# Patient Record
Sex: Male | Born: 1970 | Race: Black or African American | Hispanic: No | Marital: Married | Smoking: Former smoker
Health system: Southern US, Community
[De-identification: ages and names within clinical notes are randomized; demographics above are authoritative.]

## PROBLEM LIST (undated history)

## (undated) DIAGNOSIS — G839 Paralytic syndrome, unspecified: Secondary | ICD-10-CM

## (undated) DIAGNOSIS — G8929 Other chronic pain: Secondary | ICD-10-CM

## (undated) DIAGNOSIS — I10 Essential (primary) hypertension: Secondary | ICD-10-CM

## (undated) DIAGNOSIS — S143XXA Injury of brachial plexus, initial encounter: Secondary | ICD-10-CM

## (undated) HISTORY — PX: OTHER SURGICAL HISTORY: SHX169

## (undated) HISTORY — DX: Injury of brachial plexus, initial encounter: S14.3XXA

## (undated) HISTORY — DX: Essential (primary) hypertension: I10

---

## 1989-10-24 DIAGNOSIS — S143XXA Injury of brachial plexus, initial encounter: Secondary | ICD-10-CM

## 1989-10-24 HISTORY — DX: Injury of brachial plexus, initial encounter: S14.3XXA

## 1989-10-24 HISTORY — PX: VENA CAVA FILTER PLACEMENT: SUR1032

## 2001-06-10 ENCOUNTER — Emergency Department (HOSPITAL_COMMUNITY): Admission: EM | Admit: 2001-06-10 | Discharge: 2001-06-10 | Payer: Self-pay | Admitting: Emergency Medicine

## 2007-02-08 LAB — CONVERTED CEMR LAB
HDL: 42 mg/dL
LDL Cholesterol: 86 mg/dL

## 2008-02-06 ENCOUNTER — Encounter: Payer: Self-pay | Admitting: Internal Medicine

## 2008-08-15 LAB — CONVERTED CEMR LAB
Albumin: 4.4 g/dL
Alkaline Phosphatase: 56 units/L
CO2: 28 meq/L
Chloride: 94 meq/L
Creatinine, Ser: 0.7 mg/dL
Hgb A1c MFr Bld: 11.5 %
Sodium: 131 meq/L
Total Protein: 8.2 g/dL

## 2008-09-10 ENCOUNTER — Emergency Department (HOSPITAL_COMMUNITY): Admission: EM | Admit: 2008-09-10 | Discharge: 2008-09-10 | Payer: Self-pay | Admitting: Emergency Medicine

## 2008-09-10 LAB — CONVERTED CEMR LAB
ALT: 58 units/L
CO2: 33 meq/L
Chloride: 96 meq/L
Total Bilirubin: 0.4 mg/dL

## 2009-05-11 LAB — CONVERTED CEMR LAB
CO2: 30 meq/L
Chloride: 96 meq/L
Cholesterol: 135 mg/dL
Creatinine, Ser: 0.9 mg/dL
HDL: 42 mg/dL
Hgb A1c MFr Bld: 9.2 %
LDL Cholesterol: 82 mg/dL
Sodium: 137 meq/L
Total Bilirubin: 0.4 mg/dL
Triglyceride fasting, serum: 142 mg/dL

## 2009-07-06 ENCOUNTER — Encounter: Payer: Self-pay | Admitting: Internal Medicine

## 2009-07-06 LAB — CONVERTED CEMR LAB
CO2: 29 meq/L
Creatinine, Ser: 0.9 mg/dL
HCT: 41.1 %
LDL Cholesterol: 81 mg/dL
RDW: 15.9 %
Sodium: 137 meq/L

## 2009-09-29 LAB — CONVERTED CEMR LAB
CO2: 30 meq/L
Calcium: 9.3 mg/dL
Chloride: 97 meq/L
Creatinine, Ser: 0.7 mg/dL
HCT: 37.5 %
Hemoglobin: 11.6 g/dL
Hgb A1c MFr Bld: 9.6 %
Potassium: 4.3 meq/L
RDW: 18.9 %
Sodium: 135 meq/L
WBC: 5.3 10*3/uL

## 2009-12-13 LAB — CONVERTED CEMR LAB
ALT: 19 units/L
AST: 34 units/L
CO2: 30 meq/L
Chloride: 98 meq/L
HDL: 34 mg/dL
Lymphocytes, automated: 47.6 %
Neutrophils Relative %: 44 %
Platelets: 276 10*3/uL
RBC: 5.67 M/uL
RDW: 15.7 %
Total Bilirubin: 0.2 mg/dL
Triglyceride fasting, serum: 121 mg/dL

## 2010-01-20 ENCOUNTER — Encounter: Payer: Self-pay | Admitting: Internal Medicine

## 2010-01-20 LAB — CONVERTED CEMR LAB
BUN: 19 mg/dL
CO2: 29 meq/L
Chloride: 98 meq/L
Creatinine, Ser: 0.9 mg/dL
Hgb A1c MFr Bld: 10.3 %
Potassium: 3.9 meq/L

## 2010-02-04 ENCOUNTER — Ambulatory Visit: Payer: Self-pay | Admitting: Internal Medicine

## 2010-02-04 DIAGNOSIS — I10 Essential (primary) hypertension: Secondary | ICD-10-CM | POA: Insufficient documentation

## 2010-02-04 DIAGNOSIS — E1165 Type 2 diabetes mellitus with hyperglycemia: Secondary | ICD-10-CM

## 2010-02-04 DIAGNOSIS — E119 Type 2 diabetes mellitus without complications: Secondary | ICD-10-CM | POA: Insufficient documentation

## 2010-02-04 DIAGNOSIS — S143XXA Injury of brachial plexus, initial encounter: Secondary | ICD-10-CM | POA: Insufficient documentation

## 2010-02-04 DIAGNOSIS — Z794 Long term (current) use of insulin: Secondary | ICD-10-CM | POA: Insufficient documentation

## 2010-09-10 ENCOUNTER — Encounter: Payer: Self-pay | Admitting: Internal Medicine

## 2010-10-29 ENCOUNTER — Emergency Department (HOSPITAL_COMMUNITY)
Admission: EM | Admit: 2010-10-29 | Discharge: 2010-10-29 | Payer: Self-pay | Source: Home / Self Care | Admitting: Emergency Medicine

## 2010-10-29 ENCOUNTER — Encounter: Payer: Self-pay | Admitting: Internal Medicine

## 2010-11-23 NOTE — Letter (Signed)
Summary: Reather Littler MD  Reather Littler MD   Imported By: Lester [place] 02/09/2010 12:11:36  _____________________________________________________________________  External Attachment:    Type:   Image     Comment:   External Document

## 2010-11-23 NOTE — Letter (Signed)
Summary: [facility]  [facility]   Imported By: Sherian Rein 09/23/2010 10:05:32  _____________________________________________________________________  External Attachment:    Type:   Image     Comment:   External Document
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
## 2011-01-04 NOTE — Letter (Signed)
Summary: [facility]  [facility]   Imported By: Sherian Rein 12/31/2010 14:21:46  _____________________________________________________________________  External Attachment:    Type:   Image     Comment:   External Document

## 2012-01-24 ENCOUNTER — Encounter (HOSPITAL_COMMUNITY): Payer: Self-pay | Admitting: *Deleted

## 2012-01-24 ENCOUNTER — Emergency Department (HOSPITAL_COMMUNITY): Payer: Medicare Other

## 2012-01-24 ENCOUNTER — Emergency Department (HOSPITAL_COMMUNITY)
Admission: EM | Admit: 2012-01-24 | Discharge: 2012-01-24 | Disposition: A | Discharge status: Home / Self Care | Payer: Medicare Other | Attending: Emergency Medicine | Admitting: Emergency Medicine

## 2012-01-24 DIAGNOSIS — Z794 Long term (current) use of insulin: Secondary | ICD-10-CM | POA: Insufficient documentation

## 2012-01-24 DIAGNOSIS — M545 Low back pain, unspecified: Secondary | ICD-10-CM | POA: Insufficient documentation

## 2012-01-24 DIAGNOSIS — E119 Type 2 diabetes mellitus without complications: Secondary | ICD-10-CM | POA: Insufficient documentation

## 2012-01-24 DIAGNOSIS — M549 Dorsalgia, unspecified: Secondary | ICD-10-CM | POA: Insufficient documentation

## 2012-01-24 HISTORY — DX: Other chronic pain: G89.29

## 2012-01-24 LAB — URINALYSIS, ROUTINE W REFLEX MICROSCOPIC
Glucose, UA: NEGATIVE mg/dL
Hgb urine dipstick: NEGATIVE
Protein, ur: NEGATIVE mg/dL
pH: 5.5 (ref 5.0–8.0)

## 2012-01-24 NOTE — Discharge Instructions (Signed)
Please read and follow all provided instructions.  Your diagnoses today include:  1. Back pain    Tests performed today include:  X-rays of your lower back - shows degeneration between your 2nd and 3rd vertebrae  Urine test to look for infection that was normal  Vital signs - see below for your results today  Medications prescribed:   None  Home care instructions:   Follow any educational materials contained in this packet  Continue home pain medication  Please rest, use ice or heat on your back for the next several days  Do not lift, push, pull anything more than 10 pounds for the next week  Follow-up instructions: Please follow-up with your primary care provider in the next 1 week for further evaluation of your symptoms. If you do not have a primary care doctor -- see below for referral information.   Return instructions:  SEEK IMMEDIATE MEDICAL ATTENTION IF YOU HAVE:  New numbness, tingling, weakness, or problem with the use of your arms or legs  Severe back pain not relieved with medications  Loss control of your bowels or bladder  Increasing pain in any areas of the body (such as chest or abdominal pain)  Shortness of breath, dizziness, or fainting.   Worsening nausea (feeling sick to your stomach), vomiting, fever, or sweats  Any other emergent concerns regarding your health   Additional Information:  Your vital signs today were: BP 124/66  Pulse 102  Temp(Src) 99.3 F (37.4 C) (Oral)  Resp 22  SpO2 96% If your blood pressure (BP) was elevated above 135/85 this visit, please have this repeated by your doctor within one month. -------------- No Primary Care Doctor Call [facility]  [phone] Other agencies that provide inexpensive medical care    [facility]  [phone]    [facility]  [phone]    [facility]  [phone]    [facility]  [phone]    [facility]  [phone]    [facility]   [phone] -------------- RESOURCE GUIDE:  Dental Problems  Patients with Medicaid: [facility] [phone] [address]                                            [phone] [address] Phone:  [phone]                                                   Phone:  [phone]  If unable to pay or uninsured, contact:  [facility] or [facility]. to become qualified for the adult dental clinic.  Chronic Pain Problems Contact [facility]  [phone] Patients need to be referred by their primary care doctor.  Insufficient Money for Medicine Contact [employer]:  call "211" or [facility] [phone].  Psychological Services [facility]  [phone] [facility]  [phone] [facility]   [phone] (emergency services [phone])  Substance Abuse Resources [facility]  [phone] [facility] [phone] [facility] [phone] [facility] [phone] Residential &  Outpatient Substance Abuse Program  [phone]  Abuse/Neglect [facility] Child Abuse Hotline 985-580-0597 [facility] Child Abuse Hotline [phone] (After Hours)  Emergency Shelter [facility] [phone]  Maternity Homes [facility] [place] [facility] [phone] [facility] [phone]  [facility]  [facility]     [employer]                          [facility] Dept. [address] [place]                       [address]      [address]  [place]ondell Reveal Phone:  [phone]                                   Phone:  [phone]                 Phone:  [phone]  [facility] Phone:  [phone]  [facility] Child Abuse Hotline [phone] (After Hours)

## 2012-01-24 NOTE — ED Notes (Signed)
Pt has a pain in his right lower back which has been increasing over the past 3 weeks.  Not associated with any trauma.  Non radiating, no weakness or numbness with this.

## 2012-01-24 NOTE — ED Notes (Addendum)
Patient with lower back pain, started one month ago.  Progressively getting worse at this time.  Patient does see a pain doctor.

## 2012-01-24 NOTE — ED Provider Notes (Signed)
Medical screening examination/treatment/procedure(s) were performed by non-physician practitioner and as supervising physician I was immediately available for consultation/collaboration.  Olivia Mackie, MD 01/24/12 [phone]
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
## 2012-07-13 ENCOUNTER — Telehealth: Payer: Self-pay | Admitting: General Practice

## 2012-07-13 NOTE — Telephone Encounter (Signed)
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 For your signature. Place in [place] on  07/13/12.

## 2012-07-16 NOTE — Telephone Encounter (Signed)
Pt not seen since 01/2010 - needs OV to review before i will sign - thanks

## 2012-07-16 NOTE — Telephone Encounter (Signed)
Left message for patient to call back and schedule apt.

## 2012-07-19 NOTE — Telephone Encounter (Signed)
Called Pt again today. Number called stated it was for [employer], person who answered does not know pt.

## 2012-07-24 NOTE — Telephone Encounter (Addendum)
Called pt to inform of MD's advisement, left message for pt to callback office-Temporary placard mailed to pt's address in letter as requested.

## 2012-07-25 NOTE — Telephone Encounter (Addendum)
Pt informed 6 month temporary permit signed until OV on 08/2012 since pt has not been seen since 2011.

## 2012-09-10 ENCOUNTER — Other Ambulatory Visit (INDEPENDENT_AMBULATORY_CARE_PROVIDER_SITE_OTHER): Payer: Medicare Other

## 2012-09-10 ENCOUNTER — Ambulatory Visit (INDEPENDENT_AMBULATORY_CARE_PROVIDER_SITE_OTHER): Payer: Medicare Other | Admitting: Internal Medicine

## 2012-09-10 ENCOUNTER — Encounter: Payer: Self-pay | Admitting: Internal Medicine

## 2012-09-10 VITALS — BP 120/72 | HR 109 | Temp 99.8°F | Ht 72.0 in | Wt 289.4 lb

## 2012-09-10 DIAGNOSIS — IMO0001 Reserved for inherently not codable concepts without codable children: Secondary | ICD-10-CM

## 2012-09-10 DIAGNOSIS — Z0000 Encounter for general adult medical examination without abnormal findings: Secondary | ICD-10-CM

## 2012-09-10 DIAGNOSIS — I10 Essential (primary) hypertension: Secondary | ICD-10-CM

## 2012-09-10 DIAGNOSIS — M545 Low back pain, unspecified: Secondary | ICD-10-CM

## 2012-09-10 DIAGNOSIS — M25569 Pain in unspecified knee: Secondary | ICD-10-CM

## 2012-09-10 DIAGNOSIS — M25562 Pain in left knee: Secondary | ICD-10-CM

## 2012-09-10 DIAGNOSIS — G540 Brachial plexus disorders: Secondary | ICD-10-CM

## 2012-09-10 LAB — HEPATIC FUNCTION PANEL
AST: 22 U/L (ref 0–37)
Albumin: 4.2 g/dL (ref 3.5–5.2)
Total Bilirubin: 0.4 mg/dL (ref 0.3–1.2)

## 2012-09-10 LAB — BASIC METABOLIC PANEL
CO2: 33 mEq/L — ABNORMAL HIGH (ref 19–32)
Chloride: 96 mEq/L (ref 96–112)
Glucose, Bld: 158 mg/dL — ABNORMAL HIGH (ref 70–99)
Potassium: 4.4 mEq/L (ref 3.5–5.1)
Sodium: 135 mEq/L (ref 135–145)

## 2012-09-10 LAB — LIPID PANEL
Cholesterol: 146 mg/dL (ref 0–200)
HDL: 37.1 mg/dL — ABNORMAL LOW (ref >39.00)
LDL Cholesterol: 81 mg/dL (ref 0–99)
Total CHOL/HDL Ratio: 4
Triglycerides: 141 mg/dL (ref 0.0–149.0)
VLDL: 28.2 mg/dL (ref 0.0–40.0)

## 2012-09-10 LAB — URINALYSIS, ROUTINE W REFLEX MICROSCOPIC
Bilirubin Urine: NEGATIVE
Nitrite: NEGATIVE
Specific Gravity, Urine: 1.025 (ref 1.000–1.030)
Total Protein, Urine: NEGATIVE
pH: 6 (ref 5.0–8.0)
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
## 2012-09-10 NOTE — Assessment & Plan Note (Signed)
BP Readings from Last 3 Encounters:  09/10/12 120/72  01/24/12 124/66  02/04/10 130/82   The current medical regimen is effective;  continue present plan and medications.
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
## 2012-09-10 NOTE — Patient Instructions (Signed)
It was good to see you today. We have reviewed your prior records including labs and tests today Health Maintenance reviewed - all recommended immunizations and age-appropriate screenings are up-to-date. Test(s) ordered today. Your results will be released to MyChart (or called to you) after review, usually within 72hours after test completion. If any changes need to be made, you will be notified at that same time. we'll make referral to orthopedics and physical therapy for your shoulder, knee and back . Our office will contact you regarding appointment(s) once made. Please schedule followup in 3-4 months to continue review, call sooner if problems.

## 2012-09-10 NOTE — Assessment & Plan Note (Signed)
Insulin dep - follows with endo - Check labs now On metformin and ARB - add ASA 81 and declines statin Reports annual visit with eye doc and podiatry UTD Lab Results  Component Value Date   HGBA1C 10.3 01/20/2010

## 2012-09-14 ENCOUNTER — Telehealth: Payer: Self-pay | Admitting: *Deleted

## 2012-09-14 NOTE — Telephone Encounter (Signed)
Left msg on vm stating went to PT on yesterday fought out he does not need to see Dr. Charlett Blake will be canceling appt. He is requesting a rx for AFO...Raechel Chute

## 2012-09-14 NOTE — Telephone Encounter (Signed)
Pt return call back gave md response.../lmb 

## 2012-09-14 NOTE — Telephone Encounter (Signed)
Called pt no answer LMOM RTC.../lmb 

## 2012-09-14 NOTE — Telephone Encounter (Signed)
The physical therapy team will need to fax me a rx request for any medical equipment needs (like AFO) - I will then sign for specific equipment recommendations  Since i do not routinely prescribe these devices, it my still be in pt's best interest to establish with ortho ([facility]) but i will defer to him if he does not wish to establish

## 2012-11-07 ENCOUNTER — Ambulatory Visit (INDEPENDENT_AMBULATORY_CARE_PROVIDER_SITE_OTHER): Payer: Medicare Other | Admitting: Cardiovascular Disease

## 2012-11-07 ENCOUNTER — Encounter: Payer: Self-pay | Admitting: Cardiovascular Disease

## 2012-11-07 VITALS — BP 122/78 | HR 99 | Resp 18 | Ht 72.0 in | Wt 283.0 lb

## 2012-11-07 DIAGNOSIS — I10 Essential (primary) hypertension: Secondary | ICD-10-CM

## 2012-11-07 DIAGNOSIS — IMO0001 Reserved for inherently not codable concepts without codable children: Secondary | ICD-10-CM | POA: Insufficient documentation

## 2012-11-07 DIAGNOSIS — R079 Chest pain, unspecified: Secondary | ICD-10-CM

## 2012-11-07 DIAGNOSIS — F17210 Nicotine dependence, cigarettes, uncomplicated: Secondary | ICD-10-CM | POA: Insufficient documentation

## 2012-11-07 DIAGNOSIS — G4730 Sleep apnea, unspecified: Secondary | ICD-10-CM

## 2012-11-07 DIAGNOSIS — G4733 Obstructive sleep apnea (adult) (pediatric): Secondary | ICD-10-CM | POA: Insufficient documentation

## 2012-11-07 DIAGNOSIS — F17200 Nicotine dependence, unspecified, uncomplicated: Secondary | ICD-10-CM

## 2012-11-07 NOTE — Assessment & Plan Note (Signed)
Long standing IDDM and smoking ECG with voltage for LVH  F/U stress myovue

## 2012-11-07 NOTE — Patient Instructions (Addendum)
Your physician has requested that you have an exercise stress myoview. For further information please visit [email or web address] Please follow instruction sheet, as given.  Your physician has recommended that you have a sleep study. This test records several body functions during sleep, including: brain activity, eye movement, oxygen and carbon dioxide blood levels, heart rate and rhythm, breathing rate and rhythm, the flow of air through your mouth and nose, snoring, body muscle movements, and chest and belly movement.
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
## 2012-11-07 NOTE — Assessment & Plan Note (Signed)
Clinical diagnosis  Refer for sleep study

## 2012-11-07 NOTE — Assessment & Plan Note (Signed)
Discussed low carb diet.  Target hemoglobin A1c is 6.5 or less.  Continue current medications. F/U Kumar  

## 2012-11-07 NOTE — Assessment & Plan Note (Signed)
Well controlled.  Continue current medications and low sodium Dash type diet.    

## 2012-11-07 NOTE — Assessment & Plan Note (Addendum)
Counseled for 10 minutes on cessation Has quit cold [place] in past  Does not want help from quit line.  Dangerous combination of smoking and IDDM discussed  Yearly CXR Clinical wheezing consider PFT;s and inhaler

## 2012-12-02 ENCOUNTER — Ambulatory Visit (HOSPITAL_BASED_OUTPATIENT_CLINIC_OR_DEPARTMENT_OTHER): Payer: Medicare Other | Attending: Cardiovascular Disease

## 2012-12-02 VITALS — Ht 72.0 in | Wt 285.0 lb

## 2012-12-02 DIAGNOSIS — G4733 Obstructive sleep apnea (adult) (pediatric): Secondary | ICD-10-CM

## 2012-12-02 DIAGNOSIS — R079 Chest pain, unspecified: Secondary | ICD-10-CM

## 2012-12-03 ENCOUNTER — Ambulatory Visit (HOSPITAL_COMMUNITY): Payer: Medicare Other | Attending: Cardiology | Admitting: Radiology

## 2012-12-03 VITALS — BP 108/74 | Ht 72.0 in | Wt 281.0 lb

## 2012-12-03 DIAGNOSIS — R0609 Other forms of dyspnea: Secondary | ICD-10-CM | POA: Insufficient documentation

## 2012-12-03 DIAGNOSIS — R079 Chest pain, unspecified: Secondary | ICD-10-CM

## 2012-12-03 DIAGNOSIS — F17200 Nicotine dependence, unspecified, uncomplicated: Secondary | ICD-10-CM | POA: Insufficient documentation

## 2012-12-03 DIAGNOSIS — R5383 Other fatigue: Secondary | ICD-10-CM | POA: Insufficient documentation

## 2012-12-03 DIAGNOSIS — E119 Type 2 diabetes mellitus without complications: Secondary | ICD-10-CM | POA: Insufficient documentation

## 2012-12-03 DIAGNOSIS — R0989 Other specified symptoms and signs involving the circulatory and respiratory systems: Secondary | ICD-10-CM | POA: Insufficient documentation

## 2012-12-03 DIAGNOSIS — Z8249 Family history of ischemic heart disease and other diseases of the circulatory system: Secondary | ICD-10-CM | POA: Insufficient documentation

## 2012-12-03 DIAGNOSIS — I10 Essential (primary) hypertension: Secondary | ICD-10-CM | POA: Insufficient documentation

## 2012-12-03 DIAGNOSIS — R0789 Other chest pain: Secondary | ICD-10-CM | POA: Insufficient documentation

## 2012-12-03 DIAGNOSIS — R5381 Other malaise: Secondary | ICD-10-CM | POA: Insufficient documentation

## 2012-12-03 DIAGNOSIS — R9431 Abnormal electrocardiogram [ECG] [EKG]: Secondary | ICD-10-CM

## 2012-12-03 MED ORDER — TECHNETIUM TC 99M SESTAMIBI GENERIC - CARDIOLITE
11.0000 | Freq: Once | INTRAVENOUS | Status: AC | PRN
  Administered 2012-12-03: 11 via INTRAVENOUS

## 2012-12-03 MED ORDER — REGADENOSON 0.4 MG/5ML IV SOLN
0.4000 mg | Freq: Once | INTRAVENOUS | Status: AC
  Administered 2012-12-03: 0.4 mg via INTRAVENOUS

## 2012-12-03 MED ORDER — TECHNETIUM TC 99M SESTAMIBI GENERIC - CARDIOLITE
33.0000 | Freq: Once | INTRAVENOUS | Status: AC | PRN
  Administered 2012-12-03: 33 via INTRAVENOUS

## 2012-12-03 NOTE — Progress Notes (Signed)
[facility] SITE 3 NUCLEAR MED [address] [place], [place] [address] [phone]    Cardiology Nuclear Med Study  James Terry is a 42 y.o. male     MRN : [id number]     DOB: Mar 17, 1971  Procedure Date: 12/03/2012  Nuclear Med Background Indication for Stress Test:  Evaluation for Ischemia History:  no cardiac history Cardiac Risk Factors: Family History - CAD, Hypertension, IDDM Type 2 and Smoker  Symptoms:  Sharp Chest Pain(last date of pain:month ago) and Fatigue   Nuclear Pre-Procedure Caffeine/Decaff Intake:  None NPO After: 7:00am   Lungs:  clear O2 Sat: 95% on room air. IV 0.9% NS with Angio Cath:  20g  IV Site: R Wrist  IV Started by:  Cathlyn Parsons, RN  Chest Size (in):  52 Cup Size: n/a  Height: 6' (1.829 m)  Weight:  281 lb (127.461 kg)  BMI:  Body mass index is 38.1 kg/(m^2). Tech Comments:  Patient initial IV infiltrated prior to stress test. Restarted via R forearm with # 20 g by Sostenes Penna. Patient tolerated well.    Nuclear Med Study 1 or 2 day study: 1 day  Stress Test Type:  Treadmill/Lexiscan  Reading MD: Olga Millers, MD  Order Authorizing Provider:  Burna Cash  Resting Radionuclide: Technetium 50m Sestamibi  Resting Radionuclide Dose: 11.0 mCi   Stress Radionuclide:  Technetium 45m Sestamibi  Stress Radionuclide Dose: 33.0 mCi           Stress Protocol Rest HR: 91 Stress HR: 151  Rest BP: 108/74 Stress BP: 139/58  Exercise Time (min): 4:41 METS: 4.4   Predicted Max HR: 176 bpm % Max HR: 85.8 bpm Rate Pressure Product: 21308   Dose of Adenosine (mg):  n/a Dose of Lexiscan: 0.4 mg  Dose of Atropine (mg): n/a Dose of Dobutamine: n/a mcg/kg/min (at max HR)  Stress Test Technologist: Cathlyn Parsons, RN  Nuclear Technologist:  Domenic Polite, CNMT     Rest Procedure:  Myocardial perfusion imaging was performed at rest 45 minutes following the intravenous administration of Technetium 36m Sestamibi. Rest  ECG: NSR, early repolarization abnormality  Stress Procedure:  The patient exercised on the treadmill utilizing the Bruce Protocol for 2:30 minutes. The patient changed to low level Lexiscan due to fatigue and the handicap of Left side. Patient received IV Lexiscan 0.4mg  over 15 seconds with low level exercise. Patient had slight SOB and denied any chest pain.  Technetium 79m Sestamibi was injected at 30 seconds while patient continued to walk at low level. Myocardial perfusion imaging was performed after a 45 minute delay. Stress ECG: No significant ST segment change suggestive of ischemia.  QPS Raw Data Images:  Acquisition technically good; normal left ventricular size. Stress Images:  Normal homogeneous uptake in all areas of the myocardium. Rest Images:  Normal homogeneous uptake in all areas of the myocardium. Subtraction (SDS):  No evidence of ischemia. Transient Ischemic Dilatation (Normal <1.22):  0.99 Lung/Heart Ratio (Normal <0.45):  0.34  Quantitative Gated Spect Images QGS EDV:  111 ml QGS ESV:  57 ml  Impression Exercise Capacity:  Lexiscan with low level exercise. BP Response:  Normal blood pressure response. Clinical Symptoms:  There is dyspnea. ECG Impression:  No significant ST segment change suggestive of ischemia. Comparison with Prior Nuclear Study: No images to compare  Overall Impression:  Low risk stress nuclear study with no ischemia or infarction.  LV Ejection Fraction: 48%.  LV Wall Motion:  NL LV Function; NL  Wall Motion; LV function visually appears better than calculated EF; suggest echocardiogram to better assess.   Olga Millers

## 2012-12-07 DIAGNOSIS — G4730 Sleep apnea, unspecified: Secondary | ICD-10-CM

## 2012-12-07 DIAGNOSIS — G4710 Hypersomnia, unspecified: Secondary | ICD-10-CM

## 2012-12-07 NOTE — Procedures (Signed)
James Terry, MORKEN NO.:  [id number]  MEDICAL RECORD NO.:  [id number]          PATIENT TYPE:  OUT  LOCATION:  SLEEP CENTER                 FACILITY:  [facility]  PHYSICIAN:  Barbaraann Share, MD,FCCPDATE OF BIRTH:  07-Jun-1969  DATE OF STUDY:  12/02/2012                           NOCTURNAL POLYSOMNOGRAM  REFERRING PHYSICIAN:  Theron Arista C. Eden Emms, MD, [facility]  INDICATION FOR STUDY:  Hypersomnia with sleep apnea.  EPWORTH SLEEPINESS SCORE:  3.  MEDICATIONS:  SLEEP ARCHITECTURE:  The patient had a total sleep time of 313 minutes with no slow-wave sleep and only 55 minutes of REM.  Sleep onset latency was normal at 18 minutes and REM onset was prolonged at 164 minutes. Sleep efficiency was mildly reduced at 84%.  RESPIRATORY DATA:  The patient was found to have 67 apneas and 56 obstructive hypopneas, giving him an apnea-hypopnea index of 24 events per hour.  The events were more prominent during REM, but were not positional.  There was very loud snoring noted throughout.  The patient did not meet split night criteria secondary to most of his events occurring well after 2 a.m.  OXYGEN DATA:  There was O2 desaturation as low as 74% with the patient's obstructive events.  CARDIAC DATA:  No clinically significant arrhythmias were noted.  MOVEMENTS-PARASOMNIA:  The patient had no significant leg jerks or other abnormal behaviors seen.  IMPRESSIONS-RECOMMENDATIONS:  Moderate obstructive sleep apnea/hypopnea syndrome, with an AHI of 24 events per hour and oxygen desaturation as low as 74%.  Treatment for this degree of sleep apnea can include a trial of weight loss alone, upper airway surgery, dental appliance, and also CPAP.     Barbaraann Share, MD,FCCP Diplomate, American Board of Sleep Medicine    [facility]/MEDQ  D:  12/07/2012 10:11:18  T:  12/07/2012 21:49:52  Job:  [id number]

## 2012-12-11 ENCOUNTER — Telehealth: Payer: Self-pay | Admitting: Pulmonary Disease

## 2012-12-11 ENCOUNTER — Other Ambulatory Visit: Payer: Self-pay | Admitting: *Deleted

## 2012-12-11 DIAGNOSIS — G4730 Sleep apnea, unspecified: Secondary | ICD-10-CM

## 2012-12-11 NOTE — Telephone Encounter (Signed)
Dr. Eden Emms spoke with Dr. Shelle Iron and the patient needs to be set up for a sleep consult. Patient has had sleep study.  First available for [facility] is 12/31/12 @ 3:15. This time has been held while trying to reach the patient to confirm.  LMTCB x 1 for the patient. Told to ask for [facility].

## 2012-12-11 NOTE — Telephone Encounter (Signed)
Patient called back and confirmed date and time for sleep consult. Pt has address and phone number for the office.

## 2012-12-12 ENCOUNTER — Other Ambulatory Visit: Payer: Self-pay | Admitting: *Deleted

## 2012-12-12 DIAGNOSIS — R9430 Abnormal result of cardiovascular function study, unspecified: Secondary | ICD-10-CM

## 2012-12-14 ENCOUNTER — Other Ambulatory Visit (HOSPITAL_COMMUNITY): Payer: Medicare Other

## 2012-12-31 ENCOUNTER — Ambulatory Visit (INDEPENDENT_AMBULATORY_CARE_PROVIDER_SITE_OTHER): Payer: Medicare Other | Admitting: Pulmonary Disease

## 2012-12-31 ENCOUNTER — Encounter: Payer: Self-pay | Admitting: Pulmonary Disease

## 2012-12-31 VITALS — BP 110/72 | HR 107 | Temp 98.9°F | Ht 72.0 in | Wt 286.0 lb

## 2012-12-31 DIAGNOSIS — G4733 Obstructive sleep apnea (adult) (pediatric): Secondary | ICD-10-CM

## 2012-12-31 NOTE — Assessment & Plan Note (Signed)
The patient has moderate obstructive sleep apnea by his recent sleep study, and is clearly symptomatic as well.  I had a long discussion with him about sleep apnea, including its impact to his cardiovascular health and quality of life.  He understands this is related to his obesity, and will improve with aggressive weight loss.  In the meantime, I would recommend treating him aggressively with CPAP, and the patient is agreeable. I will set the patient up on cpap at a moderate pressure level to allow for desensitization, and will troubleshoot the device over the next 4-6weeks if needed.  The pt is to call me if having issues with tolerance.  Will then optimize the pressure once patient is able to wear cpap on a consistent basis.

## 2012-12-31 NOTE — Patient Instructions (Addendum)
Will start on cpap at a moderate pressure level.  Please call if having issues with tolerance Work on weight loss followup with me in 6weeks.  

## 2012-12-31 NOTE — Progress Notes (Signed)
Subjective:    Patient ID: James Terry, male    DOB: [birth year], 42 y.o.   MRN: [id number]  HPI The patient is a 42 year old male who been asked to see for management of obstructive sleep apnea.  He has had a recent sleep study that showed moderate OSA, with an AHI of 24 events per hour.  The patient has been noted to have loud snoring, as well as an abnormal breathing pattern during sleep.  He has frequent awakenings at night, and is not rested in the mornings upon arising.  His family notes definite sleep pressured during the day with periods of inactivity, and he will fall asleep easily watching television or movies in the evening.  He denies any sleepiness while driving.  The patient states that his weight is up 20 pounds over the last 2 years, and his Epworth score today is 10.  Sleep Questionnaire What time do you typically go to bed?( Between what hours) 11p-12am 11p-12am at 1524 on 12/31/12 by Nita Sells, CMA How long does it take you to fall asleep? 1-2 mins 1-2 mins at 1524 on 12/31/12 by Marjo Bicker Mabe, CMA How many times during the night do you wake up? 1010 7-10x at 1524 on 12/31/12 by Nita Sells, CMA What time do you get out of bed to start your day? [id number] varies at 1524 on 12/31/12 by Nita Sells, CMA Do you drive or operate heavy machinery in your occupation? No No at 1524 on 12/31/12 by Nita Sells, CMA How much has your weight changed (up or down) over the past two years? (In pounds) 20 lb (9.072 kg)20 lb (9.072 kg) +/- 20+lbs at 1524 on 12/31/12 by Nita Sells, CMA Have you ever had a sleep study before? Yes Yes at 1524 on 12/31/12 by Nita Sells, CMA If yes, location of study? [facility] at 4782 on 12/31/12 by Nita Sells, CMA If yes, date of study? feb 2014 feb 2014 at 1524 on 12/31/12 by Nita Sells, CMA Do you currently use CPAP? No No at 1524 on 12/31/12 by Marjo Bicker Mabe, CMA Do you wear oxygen at any time? No No at  1524 on 12/31/12 by Marjo Bicker Mabe, CMA    Review of Systems  Constitutional: Negative for fever and unexpected weight change.  HENT: Negative for ear pain, nosebleeds, congestion, sore throat, rhinorrhea, sneezing, trouble swallowing, dental problem, postnasal drip and sinus pressure.   Eyes: Negative for redness and itching.  Respiratory: Negative for chest tightness, shortness of breath and wheezing.   Cardiovascular: Negative for palpitations and leg swelling.  Gastrointestinal: Negative for nausea and vomiting.  Genitourinary: Negative for dysuria.  Musculoskeletal: Negative for joint swelling.  Skin: Negative for rash.  Neurological: Negative for headaches.  Hematological: Does not bruise/bleed easily.  Psychiatric/Behavioral: Negative for dysphoric mood. The patient is not nervous/anxious.        Objective:   Physical Exam Constitutional:  Morbidly obese male, no acute distress  HENT:  Nares patent without discharge, but large turbs with edema  Oropharynx without exudate, palate and uvula are thick and elongated with side wall tissue redundancy  Eyes:  Perrla, eomi, no scleral icterus  Neck:  No JVD, no TMG  Cardiovascular:  Normal rate, regular rhythm, no rubs or gallops.  No murmurs        Intact distal pulses  Pulmonary :  Normal breath sounds, no stridor or respiratory distress   No rales, rhonchi,  or wheezing  Abdominal:  Soft, nondistended, bowel sounds present.  No tenderness noted.   Musculoskeletal:  mild lower extremity edema noted.  Lymph Nodes:  No cervical lymphadenopathy noted  Skin:  No cyanosis noted  Neurologic:  Alert, appropriate, moves all 4 extremities without obvious deficit.         Assessment & Plan:

## 2013-02-12 ENCOUNTER — Ambulatory Visit: Payer: Medicare Other | Admitting: Pulmonary Disease

## 2013-02-12 ENCOUNTER — Telehealth: Payer: Self-pay | Admitting: Pulmonary Disease

## 2013-02-12 NOTE — Telephone Encounter (Signed)
Pt states that 6 week f/u appt is 03/19/13 and states that he has not been able to wear machine as directed nightly d/t tolerance issues. Patient states that his tolerance issues are improving but he wants to make sure that his D/L information is adequate---wants to make sure that he meets the compliance levels. Pt requesting that appt be moved out 1-2 weeks from 03/19/13 or if he just needs to keep the appt that he has scheduled.  Please advise, thanks.

## 2013-02-13 NOTE — Telephone Encounter (Signed)
He needs to keep that apptm, but what are his tolerance issues?  We may be able to help?

## 2013-02-13 NOTE — Telephone Encounter (Signed)
LMTCB

## 2013-02-14 NOTE — Telephone Encounter (Signed)
LMOMTCB

## 2013-02-15 NOTE — Telephone Encounter (Signed)
lmomtcb for pt 

## 2013-02-18 ENCOUNTER — Ambulatory Visit: Payer: Medicare Other | Admitting: Pulmonary Disease

## 2013-02-18 NOTE — Telephone Encounter (Signed)
ATC patient, no answer LMOMTCB 

## 2013-02-19 NOTE — Telephone Encounter (Signed)
LMOMTCB

## 2013-02-20 NOTE — Telephone Encounter (Signed)
lmtcb will sign off per triage protocol

## 2013-02-28 ENCOUNTER — Institutional Professional Consult (permissible substitution): Payer: Medicare Other | Admitting: Critical Care Medicine

## 2013-03-11 ENCOUNTER — Telehealth: Payer: Self-pay | Admitting: *Deleted

## 2013-03-11 DIAGNOSIS — I639 Cerebral infarction, unspecified: Secondary | ICD-10-CM | POA: Insufficient documentation

## 2013-03-11 DIAGNOSIS — M21372 Foot drop, left foot: Secondary | ICD-10-CM

## 2013-03-11 NOTE — Telephone Encounter (Signed)
Ok to generate rx (for CVA dx on problem list) and send last OV as requested

## 2013-03-11 NOTE — Telephone Encounter (Signed)
Left msg on vm need to get rx for AFO for (L) foot & last ov notes to send to prosthetic...Raechel Chute

## 2013-03-12 NOTE — Telephone Encounter (Signed)
Called pt no answer LMOM rx ready for pick-up.../lmb 

## 2013-03-19 ENCOUNTER — Ambulatory Visit: Payer: Medicare Other | Admitting: Pulmonary Disease

## 2013-07-08 ENCOUNTER — Other Ambulatory Visit: Payer: Self-pay | Admitting: *Deleted

## 2013-07-08 MED ORDER — TESTOSTERONE CYPIONATE 200 MG/ML IM SOLN: 200.0000 mg | Freq: Once | INTRAMUSCULAR | Status: DC

## 2013-07-26 ENCOUNTER — Other Ambulatory Visit: Payer: Self-pay | Admitting: *Deleted

## 2013-07-26 ENCOUNTER — Other Ambulatory Visit: Payer: Medicare Other

## 2013-07-26 ENCOUNTER — Other Ambulatory Visit (INDEPENDENT_AMBULATORY_CARE_PROVIDER_SITE_OTHER): Payer: Medicare Other

## 2013-07-26 DIAGNOSIS — IMO0001 Reserved for inherently not codable concepts without codable children: Secondary | ICD-10-CM

## 2013-07-26 DIAGNOSIS — E291 Testicular hypofunction: Secondary | ICD-10-CM

## 2013-07-26 LAB — URINALYSIS
Bilirubin Urine: NEGATIVE
Ketones, ur: NEGATIVE
Nitrite: NEGATIVE
Specific Gravity, Urine: 1.015 (ref 1.000–1.030)
Total Protein, Urine: NEGATIVE
Urine Glucose: >=1000
pH: 6 (ref 5.0–8.0)

## 2013-07-26 LAB — MICROALBUMIN / CREATININE URINE RATIO
Microalb Creat Ratio: 1.9 mg/g (ref 0.0–30.0)
Microalb, Ur: 2.1 mg/dL — ABNORMAL HIGH (ref 0.0–1.9)

## 2013-07-26 LAB — COMPREHENSIVE METABOLIC PANEL
ALT: 30 U/L (ref 0–53)
Albumin: 3.9 g/dL (ref 3.5–5.2)
CO2: 29 mEq/L (ref 19–32)
Calcium: 9.1 mg/dL (ref 8.4–10.5)
Chloride: 96 mEq/L (ref 96–112)
GFR: 81.31 mL/min (ref >60.00)
Glucose, Bld: 295 mg/dL — ABNORMAL HIGH (ref 70–99)
Potassium: 4.1 mEq/L (ref 3.5–5.1)
Sodium: 133 mEq/L — ABNORMAL LOW (ref 135–145)
Total Bilirubin: 0.5 mg/dL (ref 0.3–1.2)
Total Protein: 7.2 g/dL (ref 6.0–8.3)

## 2013-07-26 LAB — HEMOGLOBIN A1C: Hgb A1c MFr Bld: 11.6 % — ABNORMAL HIGH (ref 4.6–6.5)

## 2013-07-26 MED ORDER — METFORMIN HCL 500 MG PO TABS: 2000.0000 mg | ORAL_TABLET | Freq: Every morning | ORAL | Status: DC

## 2013-07-26 MED ORDER — LOSARTAN POTASSIUM 50 MG PO TABS: 50.0000 mg | ORAL_TABLET | Freq: Every day | ORAL | Status: DC

## 2013-07-29 ENCOUNTER — Encounter: Payer: Self-pay | Admitting: Endocrinology

## 2013-07-29 ENCOUNTER — Ambulatory Visit (INDEPENDENT_AMBULATORY_CARE_PROVIDER_SITE_OTHER): Payer: Medicare Other | Admitting: Endocrinology

## 2013-07-29 ENCOUNTER — Other Ambulatory Visit: Payer: Self-pay | Admitting: *Deleted

## 2013-07-29 VITALS — BP 124/78 | HR 94 | Temp 98.5°F | Resp 12 | Ht 72.0 in | Wt 278.6 lb

## 2013-07-29 DIAGNOSIS — IMO0001 Reserved for inherently not codable concepts without codable children: Secondary | ICD-10-CM

## 2013-07-29 DIAGNOSIS — Z23 Encounter for immunization: Secondary | ICD-10-CM

## 2013-07-29 DIAGNOSIS — E291 Testicular hypofunction: Secondary | ICD-10-CM

## 2013-07-29 NOTE — Patient Instructions (Addendum)
Testosterone 200 mg or 1ml every 2 weeks  Please check blood sugars at least half the time about 2 hours after any meal and as directed on waking up. Please bring blood sugar monitor to each visit  Walk daily
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
## 2013-08-19 ENCOUNTER — Other Ambulatory Visit: Payer: Medicare Other

## 2013-08-26 ENCOUNTER — Ambulatory Visit: Payer: Medicare Other | Admitting: Endocrinology

## 2013-09-09 ENCOUNTER — Other Ambulatory Visit: Payer: Self-pay | Admitting: *Deleted

## 2013-09-09 MED ORDER — AMLODIPINE BESYLATE 5 MG PO TABS: 5.0000 mg | ORAL_TABLET | Freq: Every day | ORAL | Status: DC

## 2013-09-13 ENCOUNTER — Other Ambulatory Visit: Payer: Medicare Other

## 2013-09-18 ENCOUNTER — Inpatient Hospital Stay (HOSPITAL_COMMUNITY)
Admission: EM | Admit: 2013-09-18 | Discharge: 2013-09-20 | DRG: 683 | Disposition: A | Discharge status: Home / Self Care | Payer: Medicare Other | Attending: Internal Medicine | Admitting: Internal Medicine

## 2013-09-18 ENCOUNTER — Encounter (HOSPITAL_COMMUNITY): Payer: Self-pay | Admitting: Emergency Medicine

## 2013-09-18 ENCOUNTER — Emergency Department (HOSPITAL_COMMUNITY): Payer: Medicare Other

## 2013-09-18 ENCOUNTER — Ambulatory Visit: Payer: Medicare Other | Admitting: Endocrinology

## 2013-09-18 DIAGNOSIS — E861 Hypovolemia: Secondary | ICD-10-CM | POA: Diagnosis present

## 2013-09-18 DIAGNOSIS — Z833 Family history of diabetes mellitus: Secondary | ICD-10-CM

## 2013-09-18 DIAGNOSIS — I639 Cerebral infarction, unspecified: Secondary | ICD-10-CM

## 2013-09-18 DIAGNOSIS — E872 Acidosis, unspecified: Secondary | ICD-10-CM | POA: Diagnosis present

## 2013-09-18 DIAGNOSIS — G4733 Obstructive sleep apnea (adult) (pediatric): Secondary | ICD-10-CM

## 2013-09-18 DIAGNOSIS — I10 Essential (primary) hypertension: Secondary | ICD-10-CM | POA: Diagnosis present

## 2013-09-18 DIAGNOSIS — E860 Dehydration: Secondary | ICD-10-CM | POA: Diagnosis present

## 2013-09-18 DIAGNOSIS — Z9119 Patient's noncompliance with other medical treatment and regimen: Secondary | ICD-10-CM

## 2013-09-18 DIAGNOSIS — R5381 Other malaise: Secondary | ICD-10-CM | POA: Diagnosis present

## 2013-09-18 DIAGNOSIS — G839 Paralytic syndrome, unspecified: Secondary | ICD-10-CM | POA: Diagnosis present

## 2013-09-18 DIAGNOSIS — IMO0001 Reserved for inherently not codable concepts without codable children: Secondary | ICD-10-CM | POA: Diagnosis present

## 2013-09-18 DIAGNOSIS — N179 Acute kidney failure, unspecified: Principal | ICD-10-CM | POA: Diagnosis present

## 2013-09-18 DIAGNOSIS — M21372 Foot drop, left foot: Secondary | ICD-10-CM

## 2013-09-18 DIAGNOSIS — G540 Brachial plexus disorders: Secondary | ICD-10-CM | POA: Diagnosis present

## 2013-09-18 DIAGNOSIS — G8929 Other chronic pain: Secondary | ICD-10-CM | POA: Diagnosis present

## 2013-09-18 DIAGNOSIS — R0789 Other chest pain: Secondary | ICD-10-CM | POA: Diagnosis present

## 2013-09-18 DIAGNOSIS — F17200 Nicotine dependence, unspecified, uncomplicated: Secondary | ICD-10-CM

## 2013-09-18 DIAGNOSIS — E119 Type 2 diabetes mellitus without complications: Secondary | ICD-10-CM | POA: Diagnosis present

## 2013-09-18 DIAGNOSIS — Z87891 Personal history of nicotine dependence: Secondary | ICD-10-CM

## 2013-09-18 DIAGNOSIS — Z0289 Encounter for other administrative examinations: Secondary | ICD-10-CM

## 2013-09-18 DIAGNOSIS — K219 Gastro-esophageal reflux disease without esophagitis: Secondary | ICD-10-CM | POA: Diagnosis present

## 2013-09-18 DIAGNOSIS — E291 Testicular hypofunction: Secondary | ICD-10-CM

## 2013-09-18 DIAGNOSIS — R079 Chest pain, unspecified: Secondary | ICD-10-CM

## 2013-09-18 DIAGNOSIS — Z794 Long term (current) use of insulin: Secondary | ICD-10-CM

## 2013-09-18 DIAGNOSIS — I959 Hypotension, unspecified: Secondary | ICD-10-CM

## 2013-09-18 DIAGNOSIS — Z91199 Patient's noncompliance with other medical treatment and regimen due to unspecified reason: Secondary | ICD-10-CM

## 2013-09-18 DIAGNOSIS — Z79899 Other long term (current) drug therapy: Secondary | ICD-10-CM

## 2013-09-18 DIAGNOSIS — D509 Iron deficiency anemia, unspecified: Secondary | ICD-10-CM | POA: Diagnosis present

## 2013-09-18 HISTORY — DX: Paralytic syndrome, unspecified: G83.9

## 2013-09-18 LAB — CBC WITH DIFFERENTIAL/PLATELET
Basophils Relative: 0 % (ref 0–1)
Eosinophils Absolute: 0.1 10*3/uL (ref 0.0–0.7)
HCT: 41.3 % (ref 39.0–52.0)
Lymphs Abs: 3.3 10*3/uL (ref 0.7–4.0)
MCH: 25.4 pg — ABNORMAL LOW (ref 26.0–34.0)
Monocytes Absolute: 0.4 10*3/uL (ref 0.1–1.0)
Neutrophils Relative %: 53 % (ref 43–77)
Platelets: 230 10*3/uL (ref 150–400)
RBC: 5.47 MIL/uL (ref 4.22–5.81)

## 2013-09-18 LAB — COMPREHENSIVE METABOLIC PANEL
ALT: 31 U/L (ref 0–53)
AST: 20 U/L (ref 0–37)
Albumin: 4.2 g/dL (ref 3.5–5.2)
CO2: 25 mEq/L (ref 19–32)
Calcium: 10.1 mg/dL (ref 8.4–10.5)
Sodium: 134 mEq/L — ABNORMAL LOW (ref 135–145)
Total Protein: 8.5 g/dL — ABNORMAL HIGH (ref 6.0–8.3)

## 2013-09-18 LAB — CG4 I-STAT (LACTIC ACID): Lactic Acid, Venous: 4.94 mmol/L — ABNORMAL HIGH (ref 0.5–2.2)

## 2013-09-18 LAB — PRO B NATRIURETIC PEPTIDE: Pro B Natriuretic peptide (BNP): 8.4 pg/mL (ref 0–125)

## 2013-09-18 LAB — CK: Total CK: 135 U/L (ref 7–232)

## 2013-09-18 LAB — POCT I-STAT TROPONIN I: Troponin i, poc: 0 ng/mL (ref 0.00–0.08)

## 2013-09-18 MED ORDER — PANTOPRAZOLE SODIUM 40 MG IV SOLR
40.0000 mg | Freq: Every day | INTRAVENOUS | Status: DC
  Administered 2013-09-19 (×2): 40 mg via INTRAVENOUS
  Filled 2013-09-18 (×3): qty 40

## 2013-09-18 MED ORDER — ACETAMINOPHEN 325 MG PO TABS: 650.0000 mg | ORAL_TABLET | Freq: Four times a day (QID) | ORAL | Status: DC | PRN

## 2013-09-18 MED ORDER — ACETAMINOPHEN 650 MG RE SUPP: 650.0000 mg | Freq: Four times a day (QID) | RECTAL | Status: DC | PRN

## 2013-09-18 MED ORDER — ONDANSETRON HCL 4 MG/2ML IJ SOLN
4.0000 mg | Freq: Four times a day (QID) | INTRAMUSCULAR | Status: DC | PRN
  Administered 2013-09-18: 4 mg via INTRAVENOUS
  Filled 2013-09-18: qty 2

## 2013-09-18 MED ORDER — INSULIN GLARGINE 100 UNIT/ML ~~LOC~~ SOLN
20.0000 [IU] | Freq: Every day | SUBCUTANEOUS | Status: DC
  Administered 2013-09-19: 20 [IU] via SUBCUTANEOUS
  Filled 2013-09-18 (×2): qty 0.2

## 2013-09-18 MED ORDER — METHADONE HCL 10 MG PO TABS
30.0000 mg | ORAL_TABLET | Freq: Four times a day (QID) | ORAL | Status: DC
  Administered 2013-09-19 – 2013-09-20 (×6): 30 mg via ORAL
  Filled 2013-09-18: qty 3
  Filled 2013-09-18 (×2): qty 6
  Filled 2013-09-18 (×2): qty 3
  Filled 2013-09-18: qty 6

## 2013-09-18 MED ORDER — ALBUTEROL SULFATE (5 MG/ML) 0.5% IN NEBU: 2.5000 mg | INHALATION_SOLUTION | RESPIRATORY_TRACT | Status: DC | PRN

## 2013-09-18 MED ORDER — HEPARIN SODIUM (PORCINE) 5000 UNIT/ML IJ SOLN
5000.0000 [IU] | Freq: Three times a day (TID) | INTRAMUSCULAR | Status: DC
  Administered 2013-09-19 – 2013-09-20 (×4): 5000 [IU] via SUBCUTANEOUS
  Filled 2013-09-18 (×8): qty 1

## 2013-09-18 MED ORDER — SENNA 8.6 MG PO TABS
1.0000 | ORAL_TABLET | Freq: Two times a day (BID) | ORAL | Status: DC
  Administered 2013-09-19 – 2013-09-20 (×4): 8.6 mg via ORAL
  Filled 2013-09-18 (×4): qty 1

## 2013-09-18 MED ORDER — SODIUM CHLORIDE 0.9 % IV BOLUS (SEPSIS)
1000.0000 mL | Freq: Once | INTRAVENOUS | Status: AC
  Administered 2013-09-18: 1000 mL via INTRAVENOUS

## 2013-09-18 MED ORDER — ONDANSETRON HCL 4 MG PO TABS: 4.0000 mg | ORAL_TABLET | Freq: Four times a day (QID) | ORAL | Status: DC | PRN

## 2013-09-18 MED ORDER — SODIUM CHLORIDE 0.9 % IJ SOLN
3.0000 mL | Freq: Two times a day (BID) | INTRAMUSCULAR | Status: DC
  Administered 2013-09-19: 3 mL via INTRAVENOUS

## 2013-09-18 MED ORDER — SODIUM CHLORIDE 0.9 % IV SOLN
INTRAVENOUS | Status: DC
  Administered 2013-09-18 – 2013-09-20 (×4): via INTRAVENOUS

## 2013-09-18 MED ORDER — ASPIRIN EC 325 MG PO TBEC
325.0000 mg | DELAYED_RELEASE_TABLET | Freq: Every day | ORAL | Status: DC
  Administered 2013-09-19 – 2013-09-20 (×2): 325 mg via ORAL
  Filled 2013-09-18 (×2): qty 1

## 2013-09-18 MED ORDER — ALUM & MAG HYDROXIDE-SIMETH 200-200-20 MG/5ML PO SUSP: 30.0000 mL | Freq: Four times a day (QID) | ORAL | Status: DC | PRN
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
## 2013-09-18 NOTE — ED Notes (Signed)
X-ray at bedside
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
## 2013-09-18 NOTE — ED Notes (Signed)
Assumed care of patient Patient with c/o "not pain, but a clogged up feeling in my chest." No SOB/DOE LCTA VS updated and stable NS infusing per orders Patient denies further needs at this time Side rails up, call bell in reach

## 2013-09-18 NOTE — ED Notes (Signed)
Report given to Tresa Endo, RN on 4th floor ED NT at bedside to obtain [facility] x 2 Will send patient upstairs shortly

## 2013-09-18 NOTE — ED Notes (Signed)
PA notified pt blood pressure unchanged with bolus of fluid.

## 2013-09-18 NOTE — ED Provider Notes (Signed)
Medical screening examination/treatment/procedure(s) were conducted as a shared visit with non-physician practitioner(s) and myself.  I personally evaluated the patient during the encounter.  EKG Interpretation    Date/Time:  Wednesday September 18 2013 18:05:27 EST Ventricular Rate:  96 PR Interval:  167 QRS Duration: 107 QT Interval:  363 QTC Calculation: 459 R Axis:   57 Text Interpretation:  Sinus rhythm Abnormal inferior Q waves ST elevation, consider inferior injury Baseline wander in lead(s) V4 No significant change since last tracing Confirmed by Shadi Larner  MD, Sakai Wolford ([id number]) on 09/18/2013 6:19:32 PM            Pt with hypotension and generalized fatigue and poor po intake.  Bedside u/s w/o signs of pericardial effusion and lactate elevated at 5 and dehydration.  Pt also taking metformin.  Improvement in BP after 3L of fluids.    Gwyneth Sprout, MD 09/18/13 [phone]

## 2013-09-18 NOTE — ED Notes (Signed)
ED staff only able to get [facility] x 1, despite multiple attempts Will make floor aware that UA and BC x 1 still needs to be collected

## 2013-09-19 DIAGNOSIS — R079 Chest pain, unspecified: Secondary | ICD-10-CM

## 2013-09-19 DIAGNOSIS — I6789 Other cerebrovascular disease: Secondary | ICD-10-CM

## 2013-09-19 LAB — COMPREHENSIVE METABOLIC PANEL
ALT: 22 U/L (ref 0–53)
AST: 16 U/L (ref 0–37)
BUN: 25 mg/dL — ABNORMAL HIGH (ref 6–23)
CO2: 24 mEq/L (ref 19–32)
Calcium: 8.3 mg/dL — ABNORMAL LOW (ref 8.4–10.5)
Chloride: 97 mEq/L (ref 96–112)
GFR calc Af Amer: >90 mL/min (ref >90)
GFR calc non Af Amer: >90 mL/min (ref >90)
Glucose, Bld: 293 mg/dL — ABNORMAL HIGH (ref 70–99)
Total Bilirubin: 0.3 mg/dL (ref 0.3–1.2)
Total Protein: 6.8 g/dL (ref 6.0–8.3)

## 2013-09-19 LAB — URINE MICROSCOPIC-ADD ON

## 2013-09-19 LAB — GLUCOSE, CAPILLARY
Glucose-Capillary: 289 mg/dL — ABNORMAL HIGH (ref 70–99)
Glucose-Capillary: 278 mg/dL — ABNORMAL HIGH (ref 70–99)

## 2013-09-19 LAB — CBC
HCT: 33.6 % — ABNORMAL LOW (ref 39.0–52.0)
Hemoglobin: 11.3 g/dL — ABNORMAL LOW (ref 13.0–17.0)
Hemoglobin: 11.2 g/dL — ABNORMAL LOW (ref 13.0–17.0)
MCH: 25.1 pg — ABNORMAL LOW (ref 26.0–34.0)
MCHC: 33.3 g/dL (ref 30.0–36.0)
MCV: 75.2 fL — ABNORMAL LOW (ref 78.0–100.0)
Platelets: 174 10*3/uL (ref 150–400)
Platelets: 168 10*3/uL (ref 150–400)
RBC: 4.55 MIL/uL (ref 4.22–5.81)
RBC: 4.47 MIL/uL (ref 4.22–5.81)
WBC: 6.7 10*3/uL (ref 4.0–10.5)

## 2013-09-19 LAB — URINALYSIS, ROUTINE W REFLEX MICROSCOPIC
Bilirubin Urine: NEGATIVE
Glucose, UA: 250 mg/dL — AB
Hgb urine dipstick: NEGATIVE
Nitrite: NEGATIVE
Protein, ur: 30 mg/dL — AB
Specific Gravity, Urine: 1.025 (ref 1.005–1.030)
Urobilinogen, UA: 0.2 mg/dL (ref 0.0–1.0)
pH: 5 (ref 5.0–8.0)

## 2013-09-19 LAB — CREATININE, SERUM
Creatinine, Ser: 1.13 mg/dL (ref 0.50–1.35)
GFR calc non Af Amer: 77 mL/min — ABNORMAL LOW (ref >90)

## 2013-09-19 LAB — TSH: TSH: 1.248 u[IU]/mL (ref 0.350–4.500)

## 2013-09-19 LAB — TROPONIN I
Troponin I: <0.3 ng/mL (ref <0.30)
Troponin I: <0.3 ng/mL (ref <0.30)

## 2013-09-19 MED ORDER — INSULIN ASPART PROT & ASPART (70-30 MIX) 100 UNIT/ML ~~LOC~~ SUSP
25.0000 [IU] | Freq: Two times a day (BID) | SUBCUTANEOUS | Status: DC
  Administered 2013-09-19 – 2013-09-20 (×2): 25 [IU] via SUBCUTANEOUS
  Filled 2013-09-19: qty 10

## 2013-09-19 MED ORDER — INSULIN ASPART 100 UNIT/ML ~~LOC~~ SOLN
0.0000 [IU] | Freq: Three times a day (TID) | SUBCUTANEOUS | Status: DC
  Administered 2013-09-19: 8 [IU] via SUBCUTANEOUS
  Administered 2013-09-20: 5 [IU] via SUBCUTANEOUS

## 2013-09-19 MED ORDER — INSULIN ASPART 100 UNIT/ML ~~LOC~~ SOLN
0.0000 [IU] | Freq: Every day | SUBCUTANEOUS | Status: DC
  Administered 2013-09-19: 3 [IU] via SUBCUTANEOUS
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
## 2013-09-19 NOTE — Progress Notes (Signed)
[facility] PROGRESS NOTE  James Terry WUJ:[id number] DOB: 1970-11-02 DOA: 09/18/2013 PCP: Rene Paci, MD  Assessment/Plan: . Dehydration  - Clinically hypovolemic  -likely due to combo of osmotic diuresis from uncontrolled DM (not taking Lantus x 2 months), poor po intake, and diuretic  -clinically improving on IVF   . Acute renal failure  -improving on IVF - Secondary to above, also on losartan, HCTZ and lisinopril that may be contributing.  -continue to hold lisinopril and HCTZ until he sees his PCP   . Hypotension  -improved with IVF, but remains normotensive off of meds  - Suspect this is secondary to hypovolemia from dehydration. Although lactic acid elevated, I do not at this time suspect any sepsis pathophysiology  - Blood pressure has responded well to fluids; he appears nontoxic. He is also afebrile.  - We will continue to hold all antihypertensive medications, continue to hydrate with IV fluids, and follow clinical course closely.  -blood cultures x 2 so far  -UA neg for pyuria  -recheck lactate in am -Echo shows EF 50-55%, no wall motion abnormality-  . DIABETES MELLITUS, TYPE II, UNCONTROLLED  - Apparently taking 50 units of Lantus at bedtime at home  -did not take Lantus x 2 months due to cost  -plan to d/c pt on 70/30 insulin,  Start at 25 units bid for now -instructed pt to check sugars before each meal and at hs  -he endorsed noncompliance with CBGs, diet, and insulin  -Hemoglobin A1c 11.6 on 07/26/2013   . HYPERTENSION  - As noted above, all antihypertensives will be placed on hold  -instructed pt to hold all anti-HTN until he follows up with PCP  -he has BP cuff at home with which to check BP--instructed pt to go to ED or PCP ASAP if sbp <100 or >180  -my manual BP 114/72  . Chest discomfort  - Very atypical, -suspect more of GERD  -improved with PPI  - Please note a nuclear stress test done earlier this year was negative for ischemia  (12/03/12).  -troponins neg x 3   Lactic acidosis  -This was likely due to hypoperfusion from the patient's volume depletion -recheck in am  Family Communication:   Updated wife Disposition Plan:   Home when medically stable        Procedures/Studies: Dg Chest Portable 1 View  09/18/2013   CLINICAL DATA:  Chest pain  EXAM: PORTABLE CHEST - 1 VIEW  COMPARISON:  None.  FINDINGS: Cardiomediastinal silhouette is unremarkable. No acute infiltrate or pleural effusion. No pulmonary edema. Postsurgical changes left clavicle and left shoulder.  IMPRESSION: No active disease.   Electronically Signed   By: Natasha Mead M.D.   On: 09/18/2013 19:02         Subjective: Patient is feeling much better. He denies any fevers, chills, chest discomfort, shortness breath, nausea, vomiting, diarrhea, vomiting, dysuria, hematuria.  Objective: Filed Vitals:   09/18/13 2158 09/18/13 2255 09/19/13 0510 09/19/13 1300  BP:  119/66 106/56 115/61  Pulse:  80 76 79  Temp:  98 F (36.7 C) 98 F (36.7 C) 98.5 F (36.9 C)  TempSrc:  Oral Oral Oral  Resp: 20 20 20 24   Height:  6' (1.829 m)    Weight:  121.5 kg (267 lb 13.7 oz) 121.519 kg (267 lb 14.4 oz)   SpO2: 98% 98% 99% 99%    Intake/Output Summary (Last 24 hours) at 09/19/13 1612 Last data filed at 09/19/13 1025  Gross per 24  hour  Intake 1273.75 ml  Output   1400 ml  Net -126.25 ml   Weight change:  Exam:   General:  Pt is alert, follows commands appropriately, not in acute distress  HEENT: No icterus, No thrush, No neck mass, [place]/AT  Cardiovascular: RRR, S1/S2, no rubs, no gallops  Respiratory: CTA bilaterally, no wheezing, no crackles, no rhonchi  Abdomen: Soft/+BS, non tender, non distended, no guarding  Extremities: No edema, No lymphangitis, No petechiae, No rashes, no synovitis  Data Reviewed: Basic Metabolic Panel:  Recent Labs Lab 09/18/13 1804 09/19/13 0145 09/19/13 0540  NA 134*  --  132*  K 3.6  --  4.3  CL  93*  --  97  CO2 25  --  24  GLUCOSE 182*  --  293*  BUN 30*  --  25*  CREATININE 1.68* 1.13 0.98  CALCIUM 10.1  --  8.3*   Liver Function Tests:  Recent Labs Lab 09/18/13 1804 09/19/13 0540  AST 20 16  ALT 31 22  ALKPHOS 55 44  BILITOT 0.3 0.3  PROT 8.5* 6.8  ALBUMIN 4.2 3.2*   No results found for this basename: LIPASE, AMYLASE,  in the last 168 hours No results found for this basename: AMMONIA,  in the last 168 hours CBC:  Recent Labs Lab 09/18/13 1804 09/19/13 0145 09/19/13 0540  WBC 8.1 6.7 8.3  NEUTROABS 4.3  --   --   HGB 13.9 11.3* 11.2*  HCT 41.3 34.2* 33.6*  MCV 75.5* 75.2* 75.2*  PLT 230 174 168   Cardiac Enzymes:  Recent Labs Lab 09/18/13 1804 09/19/13 0145 09/19/13 0540 09/19/13 1045  CKTOTAL 135  --   --   --   TROPONINI  --  <0.30 <0.30 <0.30   BNP: No components found with this basename: POCBNP,  CBG:  Recent Labs Lab 09/18/13 1806  GLUCAP 158*    No results found for this or any previous visit (from the past 240 hour(s)).   Scheduled Meds: . aspirin EC  325 mg Oral Daily  . heparin  5,000 Units Subcutaneous Q8H  . insulin aspart  0-15 Units Subcutaneous TID WC  . insulin aspart  0-5 Units Subcutaneous QHS  . insulin glargine  20 Units Subcutaneous QHS  . methadone  30 mg Oral QID  . pantoprazole (PROTONIX) IV  40 mg Intravenous QHS  . senna  1 tablet Oral BID  . sodium chloride  3 mL Intravenous Q12H   Continuous Infusions: . sodium chloride 125 mL/hr at 09/19/13 0735     Dalton Molesworth, DO  [facility] Pager [phone]  If 7PM-7AM, please contact night-coverage [email or web address] Password [email or web address] 09/19/2013, 4:12 PM   LOS: 1 day

## 2013-09-19 NOTE — Progress Notes (Signed)
Echocardiogram 2D Echocardiogram has been performed.  Dorothey Baseman 09/19/2013, 9:44 AM

## 2013-09-20 DIAGNOSIS — IMO0001 Reserved for inherently not codable concepts without codable children: Secondary | ICD-10-CM

## 2013-09-20 LAB — LACTIC ACID, PLASMA: Lactic Acid, Venous: 1.6 mmol/L (ref 0.5–2.2)

## 2013-09-20 LAB — BASIC METABOLIC PANEL
CO2: 24 mEq/L (ref 19–32)
Calcium: 8 mg/dL — ABNORMAL LOW (ref 8.4–10.5)
Chloride: 104 mEq/L (ref 96–112)
GFR calc Af Amer: >90 mL/min (ref >90)
GFR calc non Af Amer: >90 mL/min (ref >90)
Glucose, Bld: 246 mg/dL — ABNORMAL HIGH (ref 70–99)
Potassium: 4.7 mEq/L (ref 3.5–5.1)
Sodium: 137 mEq/L (ref 135–145)

## 2013-09-20 LAB — GLUCOSE, CAPILLARY
Glucose-Capillary: 229 mg/dL — ABNORMAL HIGH (ref 70–99)
Glucose-Capillary: 237 mg/dL — ABNORMAL HIGH (ref 70–99)

## 2013-09-20 LAB — MAGNESIUM: Magnesium: 1.6 mg/dL (ref 1.5–2.5)

## 2013-09-20 LAB — IRON AND TIBC
Iron: 103 ug/dL (ref 42–135)
Saturation Ratios: 38 % (ref 20–55)
UIBC: 166 ug/dL (ref 125–400)

## 2013-09-20 MED ORDER — INSULIN ASPART PROT & ASPART (70-30 MIX) 100 UNIT/ML ~~LOC~~ SUSP: 30.0000 [IU] | Freq: Two times a day (BID) | SUBCUTANEOUS | Status: DC

## 2013-09-20 MED ORDER — INSULIN ASPART PROT & ASPART (70-30 MIX) 100 UNIT/ML ~~LOC~~ SUSP
30.0000 [IU] | Freq: Two times a day (BID) | SUBCUTANEOUS | Status: DC
  Filled 2013-09-20: qty 10

## 2013-09-20 NOTE — Progress Notes (Signed)
Pt left facility with his daughter at his side. Pt alert, oriented, and without c/o. Discharge instructions/prescription given/explained with pt verbalizing understanding.

## 2013-09-23 ENCOUNTER — Other Ambulatory Visit (INDEPENDENT_AMBULATORY_CARE_PROVIDER_SITE_OTHER): Payer: Medicare Other

## 2013-09-23 DIAGNOSIS — IMO0001 Reserved for inherently not codable concepts without codable children: Secondary | ICD-10-CM

## 2013-09-23 LAB — BASIC METABOLIC PANEL
BUN: 15 mg/dL (ref 6–23)
Chloride: 102 mEq/L (ref 96–112)
GFR: 91.2 mL/min (ref >60.00)
Glucose, Bld: 102 mg/dL — ABNORMAL HIGH (ref 70–99)
Potassium: 3.9 mEq/L (ref 3.5–5.1)
Sodium: 138 mEq/L (ref 135–145)

## 2013-09-23 LAB — LIPID PANEL
Cholesterol: 146 mg/dL (ref 0–200)
LDL Cholesterol: 73 mg/dL (ref 0–99)
VLDL: 28.6 mg/dL (ref 0.0–40.0)

## 2013-09-23 LAB — FRUCTOSAMINE: Fructosamine: 451 umol/L — ABNORMAL HIGH (ref <285)

## 2013-09-25 ENCOUNTER — Encounter: Payer: Self-pay | Admitting: Endocrinology

## 2013-09-25 ENCOUNTER — Ambulatory Visit (INDEPENDENT_AMBULATORY_CARE_PROVIDER_SITE_OTHER): Payer: Medicare Other | Admitting: Endocrinology

## 2013-09-25 ENCOUNTER — Other Ambulatory Visit: Payer: Self-pay | Admitting: *Deleted

## 2013-09-25 VITALS — BP 124/84 | HR 60 | Temp 98.2°F | Resp 12 | Ht 71.5 in | Wt 289.7 lb

## 2013-09-25 DIAGNOSIS — I10 Essential (primary) hypertension: Secondary | ICD-10-CM

## 2013-09-25 DIAGNOSIS — E291 Testicular hypofunction: Secondary | ICD-10-CM

## 2013-09-25 DIAGNOSIS — N289 Disorder of kidney and ureter, unspecified: Secondary | ICD-10-CM

## 2013-09-25 DIAGNOSIS — IMO0001 Reserved for inherently not codable concepts without codable children: Secondary | ICD-10-CM

## 2013-09-25 LAB — CULTURE, BLOOD (ROUTINE X 2): Culture: NO GROWTH

## 2013-09-25 LAB — GLUCOSE, POCT (MANUAL RESULT ENTRY): POC Glucose: 238 mg/dl — AB (ref 70–99)

## 2013-09-25 MED ORDER — GLUCOSE BLOOD VI STRP: ORAL_STRIP | Status: DC

## 2013-09-25 MED ORDER — TRIAMTERENE-HCTZ 37.5-25 MG PO TABS: 1.0000 | ORAL_TABLET | Freq: Every day | ORAL | Status: DC

## 2013-09-25 MED ORDER — TESTOSTERONE CYPIONATE 200 MG/ML IM SOLN: 200.0000 mg | INTRAMUSCULAR | Status: DC
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
## 2013-09-25 NOTE — Patient Instructions (Addendum)
Please check blood sugars at least half the time about 2 hours after any meal and as directed on waking up. Please bring blood sugar monitor to each visit  Maxzide 25mg  daily, no BP meds  Levemir 50 units at night daily

## 2013-10-01 ENCOUNTER — Ambulatory Visit: Payer: Medicare Other | Admitting: Nutrition

## 2013-10-09 ENCOUNTER — Encounter: Payer: Medicare Other | Attending: Endocrinology | Admitting: Nutrition

## 2013-10-10 ENCOUNTER — Telehealth: Payer: Self-pay | Admitting: *Deleted

## 2013-10-10 NOTE — Telephone Encounter (Signed)
Is he taking the generic Maxzide?  Also start taking lisinopril 20 mg daily

## 2013-10-10 NOTE — Telephone Encounter (Signed)
Noted, pt is aware 

## 2013-10-10 NOTE — Telephone Encounter (Signed)
Pt's wife called,  she said since her husband was taken off his BP meds, his Blood Pressure has been rising, most recently it was 150/96, wife wants to know if he should go back on at least one of his bp meds?

## 2013-10-13 ENCOUNTER — Emergency Department (HOSPITAL_COMMUNITY)
Admission: EM | Admit: 2013-10-13 | Discharge: 2013-10-13 | Disposition: A | Discharge status: Home / Self Care | Payer: Medicare Other | Attending: Emergency Medicine | Admitting: Emergency Medicine

## 2013-10-13 ENCOUNTER — Encounter (HOSPITAL_COMMUNITY): Payer: Self-pay | Admitting: Emergency Medicine

## 2013-10-13 DIAGNOSIS — Z760 Encounter for issue of repeat prescription: Secondary | ICD-10-CM | POA: Insufficient documentation

## 2013-10-13 DIAGNOSIS — E119 Type 2 diabetes mellitus without complications: Secondary | ICD-10-CM | POA: Insufficient documentation

## 2013-10-13 DIAGNOSIS — Z87891 Personal history of nicotine dependence: Secondary | ICD-10-CM | POA: Insufficient documentation

## 2013-10-13 DIAGNOSIS — Z79899 Other long term (current) drug therapy: Secondary | ICD-10-CM | POA: Insufficient documentation

## 2013-10-13 DIAGNOSIS — M546 Pain in thoracic spine: Secondary | ICD-10-CM | POA: Insufficient documentation

## 2013-10-13 DIAGNOSIS — G8929 Other chronic pain: Secondary | ICD-10-CM

## 2013-10-13 DIAGNOSIS — M25519 Pain in unspecified shoulder: Secondary | ICD-10-CM | POA: Insufficient documentation

## 2013-10-13 DIAGNOSIS — Z794 Long term (current) use of insulin: Secondary | ICD-10-CM | POA: Insufficient documentation

## 2013-10-13 DIAGNOSIS — I10 Essential (primary) hypertension: Secondary | ICD-10-CM | POA: Insufficient documentation

## 2013-10-13 DIAGNOSIS — G8911 Acute pain due to trauma: Secondary | ICD-10-CM | POA: Insufficient documentation

## 2013-10-13 NOTE — ED Notes (Signed)
Pt attempted to fill rx and noted a discrepancy in his prescription
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
## 2013-10-15 ENCOUNTER — Telehealth: Payer: Self-pay | Admitting: *Deleted

## 2013-10-15 NOTE — Telephone Encounter (Signed)
Spoke with pt, states this has been taken care of.

## 2013-10-15 NOTE — Telephone Encounter (Signed)
I do not prescribe methadone

## 2013-10-15 NOTE — Telephone Encounter (Signed)
[facility] Triage Call Report Triage Record Num: [id number] Operator: Freddie Breech Patient Name: James Terry Call Date & Time: 10/13/2013 5:38:20PM Patient Phone: [phone] PCP: Rene Paci Patient Gender: Male PCP Fax : [phone] Patient DOB: 06/06/1971 [facility] Name[facility] Reason for Call: Caller: [name]/Patient; PCP: Rene Paci (Adults only); CB#: [phone]; Pt requests an rx for Methadone per Dr. Felicity Coyer. His rx was written incorrectly by another MD. He is advised to call the MD that prescribed the med on 10/14/13 for a new rx. Dr. Felicity Coyer would not have been able to write this for him even had she been in the office today. Med ? Protocol. "Requests a refill or prescribed meds" Protocol(s) Used: Medication Questions - Adult Recommended Outcome per Protocol: Speak with Provider or Pharmacist within 24 hours Reason for Outcome: Requests refill of prescribed medication with valid refills; lack of medications does not put patient at clinical risk Care Advice: ~

## 2013-10-20 NOTE — ED Provider Notes (Signed)
Medical screening examination/treatment/procedure(s) were performed by non-physician practitioner and as supervising physician I was immediately available for consultation/collaboration.   Lamoine Fredricksen L Dwanna Goshert, MD 10/20/13 2100 

## 2013-10-22 ENCOUNTER — Other Ambulatory Visit (INDEPENDENT_AMBULATORY_CARE_PROVIDER_SITE_OTHER): Payer: Medicare Other

## 2013-10-22 DIAGNOSIS — IMO0001 Reserved for inherently not codable concepts without codable children: Secondary | ICD-10-CM

## 2013-10-22 LAB — URINALYSIS, ROUTINE W REFLEX MICROSCOPIC
Bilirubin Urine: NEGATIVE
Leukocytes, UA: NEGATIVE
Nitrite: NEGATIVE
Specific Gravity, Urine: >=1.03 — AB (ref 1.000–1.030)
Urine Glucose: 250 — AB
pH: 6 (ref 5.0–8.0)

## 2013-10-22 LAB — COMPREHENSIVE METABOLIC PANEL
BUN: 13 mg/dL (ref 6–23)
CO2: 32 mEq/L (ref 19–32)
Creatinine, Ser: 0.9 mg/dL (ref 0.4–1.5)
GFR: 93.43 mL/min (ref >60.00)
Glucose, Bld: 172 mg/dL — ABNORMAL HIGH (ref 70–99)
Total Bilirubin: 0.4 mg/dL (ref 0.3–1.2)
Total Protein: 7.7 g/dL (ref 6.0–8.3)

## 2013-10-22 LAB — HEMOGLOBIN A1C: Hgb A1c MFr Bld: 10.7 % — ABNORMAL HIGH (ref 4.6–6.5)

## 2013-10-25 ENCOUNTER — Ambulatory Visit (INDEPENDENT_AMBULATORY_CARE_PROVIDER_SITE_OTHER): Payer: Medicare Other | Admitting: Endocrinology

## 2013-10-25 ENCOUNTER — Encounter: Payer: Self-pay | Admitting: Endocrinology

## 2013-10-25 VITALS — BP 120/84 | HR 106 | Temp 98.1°F | Ht 71.5 in | Wt 287.7 lb

## 2013-10-25 DIAGNOSIS — E291 Testicular hypofunction: Secondary | ICD-10-CM

## 2013-10-25 DIAGNOSIS — IMO0001 Reserved for inherently not codable concepts without codable children: Secondary | ICD-10-CM

## 2013-10-25 DIAGNOSIS — E1165 Type 2 diabetes mellitus with hyperglycemia: Principal | ICD-10-CM

## 2013-10-25 DIAGNOSIS — I10 Essential (primary) hypertension: Secondary | ICD-10-CM

## 2013-10-25 MED ORDER — CANAGLIFLOZIN 300 MG PO TABS: 300.0000 mg | ORAL_TABLET | Freq: Every day | ORAL | Status: DC

## 2013-10-25 NOTE — Patient Instructions (Addendum)
Invokana 100mg  in am daily for 5 days then 300mg  daily Take 30 Levemir twice daily Watch diet and walk daily
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
## 2013-11-22 ENCOUNTER — Encounter (HOSPITAL_COMMUNITY): Payer: Self-pay | Admitting: Emergency Medicine

## 2013-11-22 ENCOUNTER — Emergency Department (HOSPITAL_COMMUNITY): Payer: Medicare Other

## 2013-11-22 ENCOUNTER — Emergency Department (HOSPITAL_COMMUNITY)
Admission: EM | Admit: 2013-11-22 | Discharge: 2013-11-22 | Disposition: A | Discharge status: Home / Self Care | Payer: Medicare Other | Attending: Emergency Medicine | Admitting: Emergency Medicine

## 2013-11-22 DIAGNOSIS — Z79899 Other long term (current) drug therapy: Secondary | ICD-10-CM | POA: Insufficient documentation

## 2013-11-22 DIAGNOSIS — R112 Nausea with vomiting, unspecified: Secondary | ICD-10-CM | POA: Insufficient documentation

## 2013-11-22 DIAGNOSIS — G8320 Monoplegia of upper limb affecting unspecified side: Secondary | ICD-10-CM | POA: Insufficient documentation

## 2013-11-22 DIAGNOSIS — I10 Essential (primary) hypertension: Secondary | ICD-10-CM | POA: Insufficient documentation

## 2013-11-22 DIAGNOSIS — R1013 Epigastric pain: Secondary | ICD-10-CM | POA: Insufficient documentation

## 2013-11-22 DIAGNOSIS — Z794 Long term (current) use of insulin: Secondary | ICD-10-CM | POA: Insufficient documentation

## 2013-11-22 DIAGNOSIS — Z87891 Personal history of nicotine dependence: Secondary | ICD-10-CM | POA: Insufficient documentation

## 2013-11-22 DIAGNOSIS — G8929 Other chronic pain: Secondary | ICD-10-CM | POA: Insufficient documentation

## 2013-11-22 DIAGNOSIS — E119 Type 2 diabetes mellitus without complications: Secondary | ICD-10-CM | POA: Insufficient documentation

## 2013-11-22 LAB — COMPREHENSIVE METABOLIC PANEL
ALT: 35 U/L (ref 0–53)
AST: 22 U/L (ref 0–37)
Albumin: 4.3 g/dL (ref 3.5–5.2)
Alkaline Phosphatase: 51 U/L (ref 39–117)
BUN: 24 mg/dL — ABNORMAL HIGH (ref 6–23)
CHLORIDE: 92 meq/L — AB (ref 96–112)
CO2: 27 meq/L (ref 19–32)
CREATININE: 1.24 mg/dL (ref 0.50–1.35)
Calcium: 10 mg/dL (ref 8.4–10.5)
GFR, EST AFRICAN AMERICAN: 80 mL/min — AB (ref >90)
GFR, EST NON AFRICAN AMERICAN: 69 mL/min — AB (ref >90)
Glucose, Bld: 177 mg/dL — ABNORMAL HIGH (ref 70–99)
Potassium: 4.8 mEq/L (ref 3.7–5.3)
Sodium: 133 mEq/L — ABNORMAL LOW (ref 137–147)
Total Bilirubin: 0.4 mg/dL (ref 0.3–1.2)
Total Protein: 8.7 g/dL — ABNORMAL HIGH (ref 6.0–8.3)

## 2013-11-22 LAB — CBC WITH DIFFERENTIAL/PLATELET
Basophils Absolute: 0 10*3/uL (ref 0.0–0.1)
Basophils Relative: 0 % (ref 0–1)
EOS PCT: 1 % (ref 0–5)
Eosinophils Absolute: 0.1 10*3/uL (ref 0.0–0.7)
HEMATOCRIT: 45.2 % (ref 39.0–52.0)
HEMOGLOBIN: 14.7 g/dL (ref 13.0–17.0)
LYMPHS ABS: 3 10*3/uL (ref 0.7–4.0)
LYMPHS PCT: 36 % (ref 12–46)
MCH: 26.2 pg (ref 26.0–34.0)
MCHC: 32.5 g/dL (ref 30.0–36.0)
MCV: 80.6 fL (ref 78.0–100.0)
MONO ABS: 0.5 10*3/uL (ref 0.1–1.0)
Monocytes Relative: 6 % (ref 3–12)
Neutro Abs: 4.7 10*3/uL (ref 1.7–7.7)
Neutrophils Relative %: 56 % (ref 43–77)
Platelets: 264 10*3/uL (ref 150–400)
RBC: 5.61 MIL/uL (ref 4.22–5.81)
RDW: 15.3 % (ref 11.5–15.5)
WBC: 8.3 10*3/uL (ref 4.0–10.5)

## 2013-11-22 LAB — URINALYSIS, ROUTINE W REFLEX MICROSCOPIC
Bilirubin Urine: NEGATIVE
GLUCOSE, UA: NEGATIVE mg/dL
HGB URINE DIPSTICK: NEGATIVE
KETONES UR: NEGATIVE mg/dL
Leukocytes, UA: NEGATIVE
Nitrite: NEGATIVE
PROTEIN: NEGATIVE mg/dL
Specific Gravity, Urine: 1.016 (ref 1.005–1.030)
Urobilinogen, UA: 0.2 mg/dL (ref 0.0–1.0)
pH: 5.5 (ref 5.0–8.0)

## 2013-11-22 LAB — POCT I-STAT TROPONIN I: Troponin i, poc: 0 ng/mL (ref 0.00–0.08)

## 2013-11-22 LAB — LIPASE, BLOOD: Lipase: 22 U/L (ref 11–59)

## 2013-11-22 MED ORDER — ONDANSETRON HCL 4 MG/2ML IJ SOLN
4.0000 mg | Freq: Once | INTRAMUSCULAR | Status: AC
  Administered 2013-11-22: 4 mg via INTRAVENOUS
  Filled 2013-11-22: qty 2

## 2013-11-22 MED ORDER — ONDANSETRON HCL 4 MG PO TABS: 4.0000 mg | ORAL_TABLET | Freq: Four times a day (QID) | ORAL | Status: DC

## 2013-11-22 MED ORDER — PANTOPRAZOLE SODIUM 40 MG IV SOLR: 40.0000 mg | Freq: Once | INTRAVENOUS | Status: DC

## 2013-11-22 MED ORDER — PANTOPRAZOLE SODIUM 40 MG PO TBEC
40.0000 mg | DELAYED_RELEASE_TABLET | Freq: Once | ORAL | Status: AC
  Administered 2013-11-22: 40 mg via ORAL
  Filled 2013-11-22: qty 1

## 2013-11-22 MED ORDER — SODIUM CHLORIDE 0.9 % IV BOLUS (SEPSIS)
1000.0000 mL | Freq: Once | INTRAVENOUS | Status: AC
  Administered 2013-11-22: 1000 mL via INTRAVENOUS

## 2013-11-22 MED ORDER — OMEPRAZOLE 20 MG PO CPDR: 20.0000 mg | DELAYED_RELEASE_CAPSULE | Freq: Every day | ORAL | Status: DC

## 2013-11-22 NOTE — Discharge Instructions (Signed)

## 2013-11-22 NOTE — ED Notes (Signed)
Pt in radiology at this time. 
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
## 2013-11-22 NOTE — ED Notes (Signed)
Pt from home reports abd pain, emesis x1 week. Pt denies diarrhea. Pt is A&O and in NAD

## 2013-11-24 NOTE — ED Provider Notes (Signed)
Medical screening examination/treatment/procedure(s) were performed by non-physician practitioner and as supervising physician I was immediately available for consultation/collaboration.  EKG Interpretation    Date/Time:  Friday November 22 2013 19:10:07 EST Ventricular Rate:  103 PR Interval:  177 QRS Duration: 99 QT Interval:  343 QTC Calculation: 449 R Axis:   54 Text Interpretation:  Sinus tachycardia ST elev, probable normal early repol pattern No significant change since last tracing Confirmed by James Journey  DO, Kajol Crispen ([id number]) on 11/22/2013 7:22:07 PM              [name], DO 11/24/13 [id number]

## 2013-11-25 ENCOUNTER — Encounter: Payer: Self-pay | Admitting: Internal Medicine

## 2013-11-25 ENCOUNTER — Other Ambulatory Visit (INDEPENDENT_AMBULATORY_CARE_PROVIDER_SITE_OTHER): Payer: Medicare Other

## 2013-11-25 ENCOUNTER — Ambulatory Visit (INDEPENDENT_AMBULATORY_CARE_PROVIDER_SITE_OTHER): Payer: Medicare Other | Admitting: Internal Medicine

## 2013-11-25 VITALS — BP 132/90 | HR 107 | Temp 99.9°F | Wt 278.2 lb

## 2013-11-25 DIAGNOSIS — K219 Gastro-esophageal reflux disease without esophagitis: Secondary | ICD-10-CM

## 2013-11-25 DIAGNOSIS — E291 Testicular hypofunction: Secondary | ICD-10-CM

## 2013-11-25 DIAGNOSIS — Z0000 Encounter for general adult medical examination without abnormal findings: Secondary | ICD-10-CM

## 2013-11-25 DIAGNOSIS — IMO0001 Reserved for inherently not codable concepts without codable children: Secondary | ICD-10-CM

## 2013-11-25 DIAGNOSIS — I10 Essential (primary) hypertension: Secondary | ICD-10-CM

## 2013-11-25 DIAGNOSIS — R079 Chest pain, unspecified: Secondary | ICD-10-CM

## 2013-11-25 DIAGNOSIS — E1165 Type 2 diabetes mellitus with hyperglycemia: Principal | ICD-10-CM

## 2013-11-25 LAB — BASIC METABOLIC PANEL
BUN: 14 mg/dL (ref 6–23)
CHLORIDE: 98 meq/L (ref 96–112)
CO2: 29 meq/L (ref 19–32)
Calcium: 8.8 mg/dL (ref 8.4–10.5)
Creatinine, Ser: 1 mg/dL (ref 0.4–1.5)
GFR: 83.95 mL/min (ref >60.00)
Glucose, Bld: 281 mg/dL — ABNORMAL HIGH (ref 70–99)
POTASSIUM: 4.4 meq/L (ref 3.5–5.1)
SODIUM: 135 meq/L (ref 135–145)

## 2013-11-25 LAB — FRUCTOSAMINE: Fructosamine: 370 umol/L — ABNORMAL HIGH (ref <285)

## 2013-11-25 LAB — TESTOSTERONE: Testosterone: 226.81 ng/dL — ABNORMAL LOW (ref 350.00–890.00)

## 2013-11-25 MED ORDER — TRIAMTERENE-HCTZ 37.5-25 MG PO TABS: 0.5000 | ORAL_TABLET | Freq: Every day | ORAL | Status: DC

## 2013-11-25 MED ORDER — OMEPRAZOLE 20 MG PO CPDR: 20.0000 mg | DELAYED_RELEASE_CAPSULE | Freq: Two times a day (BID) | ORAL | Status: DC

## 2013-11-25 NOTE — Patient Instructions (Addendum)
It was good to see you today.  Health Maintenance reviewed - all recommended immunizations and age-appropriate screenings are up-to-date.  We have reviewed your prior records including labs and tests today  Medications reviewed and updated -increase Prilosec to twice daily for indigestion symptoms causing pain Other medications reviewed, see below for clarifications of recommended prescription schedule of all medications  Your prescription(s) have been submitted to your pharmacy. Please take as directed and contact our office if you believe you are having problem(s) with the medication(s).  Continue working with her other specialists as ongoing  Followup here in 6 months for routine review, please call sooner if problems  Diabetes and Standards of Medical Care  Diabetes is complicated. You may find that your diabetes team includes a dietitian, nurse, diabetes educator, eye doctor, and more. To help everyone know what is going on and to help you get the care you deserve, the following schedule of care was developed to help keep you on track. Below are the tests, exams, vaccines, medicines, education, and plans you will need. HbA1c test This test shows how well you have controlled your glucose over the past 2 3 months. It is used to see if your diabetes management plan needs to be adjusted.   It is performed at least 2 times a year if you are meeting treatment goals.  It is performed 4 times a year if therapy has changed or if you are not meeting treatment goals. Blood pressure test  This test is performed at every routine medical visit. The goal is less than 140/90 mmHg for most people, but 130/80 mmHg in some cases. Ask your health care provider about your goal. Dental exam  Follow up with the dentist regularly. Eye exam  If you are diagnosed with type 1 diabetes as a child, get an exam upon reaching the age of 53 years or older and have had diabetes for 3 5 years. Yearly eye exams are  recommended after that initial eye exam.  If you are diagnosed with type 1 diabetes as an adult, get an exam within 5 years of diagnosis and then yearly.  If you are diagnosed with type 2 diabetes, get an exam as soon as possible after the diagnosis and then yearly. Foot care exam  Visual foot exams are performed at every routine medical visit. The exams check for cuts, injuries, or other problems with the feet.  A comprehensive foot exam should be done yearly. This includes visual inspection as well as assessing foot pulses and testing for loss of sensation.  Check your feet nightly for cuts, injuries, or other problems with your feet. Tell your health care provider if anything is not healing. Kidney function test (urine microalbumin)  This test is performed once a year.  Type 1 diabetes: The first test is performed 5 years after diagnosis.  Type 2 diabetes: The first test is performed at the time of diagnosis.  A serum creatinine and estimated glomerular filtration rate (eGFR) test is done once a year to assess the level of chronic kidney disease (CKD), if present. Lipid profile (cholesterol, HDL, LDL, triglycerides)  Performed every 5 years for most people.  The goal for LDL is less than 100 mg/dL. If you are at high risk, the goal is less than 70 mg/dL.  The goal for HDL is 40 mg/dL 50 mg/dL for men and 50 mg/dL 60 mg/dL for women. An HDL cholesterol of 60 mg/dL or higher gives some protection against heart disease.  The goal for triglycerides is less than 150 mg/dL. Influenza vaccine, pneumococcal vaccine, and hepatitis B vaccine  The influenza vaccine is recommended yearly.  The pneumococcal vaccine is generally given once in a lifetime. However, there are some instances when another vaccination is recommended. Check with your health care provider.  The hepatitis B vaccine is also recommended for adults with diabetes. Diabetes self-management education  Education is  recommended at diagnosis and ongoing as needed. Treatment plan  Your treatment plan is reviewed at every medical visit. Document Released: 08/07/2009 Document Revised: 06/12/2013 Document Reviewed: 03/12/2013 [facility] Patient Information 2014 [place]. DASH Diet The DASH diet stands for "Dietary Approaches to Stop Hypertension." It is a healthy eating plan that has been shown to reduce high blood pressure (hypertension) in as little as 14 days, while also possibly providing other significant health benefits. These other health benefits include reducing the risk of breast cancer after menopause and reducing the risk of type 2 diabetes, heart disease, colon cancer, and stroke. Health benefits also include weight loss and slowing kidney failure in patients with chronic kidney disease.  DIET GUIDELINES  Limit salt (sodium). Your diet should contain less than 1500 mg of sodium daily.  Limit refined or processed carbohydrates. Your diet should include mostly whole grains. Desserts and added sugars should be used sparingly.  Include small amounts of heart-healthy fats. These types of fats include nuts, oils, and tub margarine. Limit saturated and trans fats. These fats have been shown to be harmful in the body. CHOOSING FOODS  The following food groups are based on a 2000 calorie diet. See your Registered Dietitian for individual calorie needs. Grains and Grain Products (6 to 8 servings daily)  Eat More Often: Whole-wheat bread, brown rice, whole-grain or wheat pasta, quinoa, popcorn without added fat or salt (air popped).  Eat Less Often: White bread, white pasta, white rice, cornbread. Vegetables (4 to 5 servings daily)  Eat More Often: Fresh, frozen, and canned vegetables. Vegetables may be raw, steamed, roasted, or grilled with a minimal amount of fat.  Eat Less Often/Avoid: Creamed or fried vegetables. Vegetables in a cheese sauce. Fruit (4 to 5 servings daily)  Eat More Often: All  fresh, canned (in natural juice), or frozen fruits. Dried fruits without added sugar. One hundred percent fruit juice ( cup [237 mL] daily).  Eat Less Often: Dried fruits with added sugar. Canned fruit in light or heavy syrup. [employer], Fish, and Poultry (2 servings or less daily. One serving is 3 to 4 oz [85-114 g]).  Eat More Often: Ninety percent or leaner ground beef, tenderloin, sirloin. Round cuts of beef, chicken breast, [place] breast. All fish. Grill, bake, or broil your meat. Nothing should be fried.  Eat Less Often/Avoid: Fatty cuts of meat, [place], or chicken leg, thigh, or wing. Fried cuts of meat or fish. Dairy (2 to 3 servings)  Eat More Often: Low-fat or fat-free milk, low-fat plain or light yogurt, reduced-fat or part-skim cheese.  Eat Less Often/Avoid: Milk (whole, 2%).Whole milk yogurt. Full-fat cheeses. Nuts, Seeds, and Legumes (4 to 5 servings per week)  Eat More Often: All without added salt.  Eat Less Often/Avoid: Salted nuts and seeds, canned beans with added salt. Fats and Sweets (limited)  Eat More Often: Vegetable oils, tub margarines without trans fats, sugar-free gelatin. Mayonnaise and salad dressings.  Eat Less Often/Avoid: Coconut oils, palm oils, butter, stick margarine, cream, half and half, cookies, candy, pie. FOR MORE INFORMATION The Dash Diet Eating Plan:  www.dashdiet.org Document Released: 09/29/2011 Document Revised: 01/02/2012 Document Reviewed: 09/29/2011 [facility] Patient Information 2014 [place], [place].

## 2013-11-25 NOTE — Assessment & Plan Note (Signed)
Insulin dep - follows with endo Lucianne MussKumar- Therapy complicated by noncompliance On metformin and ACEI + ASA 81; declines statin Reports annual visit with eye doc and podiatry UTD Lab Results  Component Value Date   HGBA1C 10.7* 10/22/2013
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
## 2013-11-25 NOTE — Progress Notes (Signed)
Pre-visit discussion using our clinic review tool. No additional management support is needed unless otherwise documented below in the visit note.  

## 2013-11-25 NOTE — Assessment & Plan Note (Signed)
Prior cardiology evaluation reviewed, no symptoms of angina at this time though multiple cardiac risk factors for same reviewed Emergency room evaluation late January 2015 for same reviewed -felt to be GERD related Increase PPI to twice daily Patient to followup here if persisting symptoms unimproved with maximal therapy for reflux to consider further cardiac and/or GI evaluation as needed

## 2013-11-25 NOTE — Assessment & Plan Note (Signed)
BP Readings from Last 3 Encounters:  11/25/13 132/90  11/22/13 106/56  10/25/13 120/84  Various changes in past 6 months reviewed in the setting of acute renal deficiency with dehydration 08/2013 - Reviewed now recommendations for ongoing Zestoretic with low-dose diuretic and daily amlodipine Normal creatinine last week at emergency room evaluation for indigestion Med reconciliation performed, clarification provided

## 2013-11-29 ENCOUNTER — Other Ambulatory Visit: Payer: Medicare Other

## 2013-12-04 ENCOUNTER — Encounter: Payer: Self-pay | Admitting: Endocrinology

## 2013-12-04 ENCOUNTER — Telehealth: Payer: Self-pay | Admitting: *Deleted

## 2013-12-04 ENCOUNTER — Ambulatory Visit (INDEPENDENT_AMBULATORY_CARE_PROVIDER_SITE_OTHER): Payer: Medicare Other | Admitting: Endocrinology

## 2013-12-04 VITALS — BP 130/88 | HR 91 | Temp 98.2°F | Resp 14 | Ht 71.5 in | Wt 282.4 lb

## 2013-12-04 DIAGNOSIS — E1165 Type 2 diabetes mellitus with hyperglycemia: Principal | ICD-10-CM

## 2013-12-04 DIAGNOSIS — IMO0001 Reserved for inherently not codable concepts without codable children: Secondary | ICD-10-CM

## 2013-12-04 MED ORDER — ONDANSETRON HCL 4 MG PO TABS: 4.0000 mg | ORAL_TABLET | Freq: Four times a day (QID) | ORAL | Status: DC

## 2013-12-04 NOTE — Patient Instructions (Addendum)
Please check blood sugars at least half the time about 2 hours after any meal and as directed on waking up. Please bring blood sugar monitor to each visit Start walking  Start Invokana

## 2013-12-04 NOTE — Telephone Encounter (Signed)
Spouse phoned stating that pharmacy did not receive prilosec nor zofran orders.  Pharmacy confirmed receipt of prilosec, but no zofran-re-submitting zofran.
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
## 2014-01-09 ENCOUNTER — Other Ambulatory Visit: Payer: Self-pay | Admitting: *Deleted

## 2014-01-09 MED ORDER — INSULIN LISPRO 100 UNIT/ML CARTRIDGE: 16.0000 [IU] | SUBCUTANEOUS | Status: DC

## 2014-01-10 ENCOUNTER — Other Ambulatory Visit: Payer: Self-pay | Admitting: *Deleted

## 2014-01-10 ENCOUNTER — Other Ambulatory Visit: Payer: Medicare Other

## 2014-01-10 ENCOUNTER — Other Ambulatory Visit (INDEPENDENT_AMBULATORY_CARE_PROVIDER_SITE_OTHER): Payer: Medicare Other

## 2014-01-10 DIAGNOSIS — E1165 Type 2 diabetes mellitus with hyperglycemia: Principal | ICD-10-CM

## 2014-01-10 DIAGNOSIS — IMO0001 Reserved for inherently not codable concepts without codable children: Secondary | ICD-10-CM

## 2014-01-10 LAB — COMPREHENSIVE METABOLIC PANEL
ALBUMIN: 4.2 g/dL (ref 3.5–5.2)
ALK PHOS: 47 U/L (ref 39–117)
ALT: 47 U/L (ref 0–53)
AST: 31 U/L (ref 0–37)
BUN: 12 mg/dL (ref 6–23)
CALCIUM: 9.3 mg/dL (ref 8.4–10.5)
CHLORIDE: 96 meq/L (ref 96–112)
CO2: 29 meq/L (ref 19–32)
Creatinine, Ser: 1 mg/dL (ref 0.4–1.5)
GFR: 84.86 mL/min (ref >60.00)
Glucose, Bld: 308 mg/dL — ABNORMAL HIGH (ref 70–99)
POTASSIUM: 3.9 meq/L (ref 3.5–5.1)
SODIUM: 135 meq/L (ref 135–145)
TOTAL PROTEIN: 7.8 g/dL (ref 6.0–8.3)
Total Bilirubin: 0.4 mg/dL (ref 0.3–1.2)

## 2014-01-10 LAB — HEMOGLOBIN A1C: Hgb A1c MFr Bld: 10 % — ABNORMAL HIGH (ref 4.6–6.5)

## 2014-01-15 ENCOUNTER — Ambulatory Visit (INDEPENDENT_AMBULATORY_CARE_PROVIDER_SITE_OTHER): Payer: Medicare Other | Admitting: Endocrinology

## 2014-01-15 ENCOUNTER — Other Ambulatory Visit: Payer: Self-pay | Admitting: *Deleted

## 2014-01-15 ENCOUNTER — Encounter: Payer: Self-pay | Admitting: Endocrinology

## 2014-01-15 VITALS — BP 124/84 | HR 116 | Temp 98.3°F | Resp 16 | Ht 71.5 in | Wt 284.6 lb

## 2014-01-15 DIAGNOSIS — I10 Essential (primary) hypertension: Secondary | ICD-10-CM

## 2014-01-15 DIAGNOSIS — IMO0001 Reserved for inherently not codable concepts without codable children: Secondary | ICD-10-CM

## 2014-01-15 DIAGNOSIS — E291 Testicular hypofunction: Secondary | ICD-10-CM

## 2014-01-15 DIAGNOSIS — E1165 Type 2 diabetes mellitus with hyperglycemia: Principal | ICD-10-CM

## 2014-01-15 MED ORDER — INSULIN NPH (HUMAN) (ISOPHANE) 100 UNIT/ML ~~LOC~~ SUSP: SUBCUTANEOUS | Status: DC

## 2014-01-15 MED ORDER — INSULIN DETEMIR 100 UNIT/ML ~~LOC~~ SOLN: 50.0000 [IU] | Freq: Every day | SUBCUTANEOUS | Status: DC
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
## 2014-01-15 NOTE — Patient Instructions (Addendum)
Humulin N insulin, 35 units in am and at BEDTIME Stop Levemir  Continue Humalog 30 units with every meal  Check sugar 3x daily  Walk daily  Low Carb diet

## 2014-01-29 ENCOUNTER — Encounter: Payer: Medicare Other | Admitting: Nutrition

## 2014-02-24 ENCOUNTER — Other Ambulatory Visit: Payer: Medicare Other

## 2014-02-26 ENCOUNTER — Ambulatory Visit: Payer: Medicare Other | Admitting: Endocrinology

## 2014-02-26 DIAGNOSIS — Z0289 Encounter for other administrative examinations: Secondary | ICD-10-CM

## 2014-03-21 ENCOUNTER — Other Ambulatory Visit: Payer: Medicare Other

## 2014-03-21 ENCOUNTER — Other Ambulatory Visit (INDEPENDENT_AMBULATORY_CARE_PROVIDER_SITE_OTHER): Payer: Medicare Other

## 2014-03-21 DIAGNOSIS — E1165 Type 2 diabetes mellitus with hyperglycemia: Principal | ICD-10-CM

## 2014-03-21 DIAGNOSIS — IMO0001 Reserved for inherently not codable concepts without codable children: Secondary | ICD-10-CM

## 2014-03-21 LAB — BASIC METABOLIC PANEL
BUN: 13 mg/dL (ref 6–23)
CO2: 31 mEq/L (ref 19–32)
Calcium: 9.7 mg/dL (ref 8.4–10.5)
Chloride: 99 mEq/L (ref 96–112)
Creatinine, Ser: 0.9 mg/dL (ref 0.4–1.5)
GFR: 96.85 mL/min (ref >60.00)
Glucose, Bld: 157 mg/dL — ABNORMAL HIGH (ref 70–99)
Potassium: 3.8 mEq/L (ref 3.5–5.1)
SODIUM: 136 meq/L (ref 135–145)

## 2014-03-24 LAB — FRUCTOSAMINE: FRUCTOSAMINE: 365 umol/L — AB (ref 190–270)

## 2014-03-26 ENCOUNTER — Ambulatory Visit (INDEPENDENT_AMBULATORY_CARE_PROVIDER_SITE_OTHER): Payer: Medicare Other | Admitting: Endocrinology

## 2014-03-26 ENCOUNTER — Encounter: Payer: Self-pay | Admitting: Endocrinology

## 2014-03-26 ENCOUNTER — Other Ambulatory Visit: Payer: Self-pay | Admitting: *Deleted

## 2014-03-26 VITALS — BP 132/86 | HR 100 | Temp 98.1°F | Resp 16 | Ht 71.5 in | Wt 277.4 lb

## 2014-03-26 DIAGNOSIS — E291 Testicular hypofunction: Secondary | ICD-10-CM

## 2014-03-26 DIAGNOSIS — I10 Essential (primary) hypertension: Secondary | ICD-10-CM

## 2014-03-26 DIAGNOSIS — E1165 Type 2 diabetes mellitus with hyperglycemia: Principal | ICD-10-CM

## 2014-03-26 DIAGNOSIS — IMO0001 Reserved for inherently not codable concepts without codable children: Secondary | ICD-10-CM

## 2014-03-26 LAB — GLUCOSE, POCT (MANUAL RESULT ENTRY): POC Glucose: 267 mg/dl — AB (ref 70–99)

## 2014-03-26 MED ORDER — LISINOPRIL-HYDROCHLOROTHIAZIDE 20-12.5 MG PO TABS: 1.0000 | ORAL_TABLET | Freq: Every day | ORAL | Status: DC

## 2014-03-26 MED ORDER — LIRAGLUTIDE 18 MG/3ML ~~LOC~~ SOPN: PEN_INJECTOR | SUBCUTANEOUS | Status: DC
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
## 2014-03-26 NOTE — Patient Instructions (Addendum)
Please check blood sugars at least half the time about 2 hours after any meal and as directed on waking up. Please bring blood sugar monitor to each visit  Restart Lisinopril HCT, stop Triam-HCTZ  Take Humalog 3x daily before meals and N, 2x daily as directed  Start VICTOZA injection with the sample pen once daily at the same time of the day.  Dial the dose to 0.6 mg for the first week.  You may  experience nausea in the first few days which usually gets better  After 1 week increase the dose to 1.2mg  daily if no nausea.  You may inject in the stomach, thigh or arm.    You will feel fullness of the stomach with starting the medication and should try to keep portions of food small.

## 2014-03-28 ENCOUNTER — Other Ambulatory Visit: Payer: Self-pay | Admitting: *Deleted

## 2014-03-28 MED ORDER — EXENATIDE ER 2 MG ~~LOC~~ PEN: PEN_INJECTOR | SUBCUTANEOUS | Status: DC

## 2014-04-02 ENCOUNTER — Other Ambulatory Visit: Payer: Self-pay | Admitting: Endocrinology

## 2014-04-21 ENCOUNTER — Other Ambulatory Visit (INDEPENDENT_AMBULATORY_CARE_PROVIDER_SITE_OTHER): Payer: Medicare Other

## 2014-04-21 ENCOUNTER — Other Ambulatory Visit: Payer: Medicare Other

## 2014-04-21 DIAGNOSIS — IMO0001 Reserved for inherently not codable concepts without codable children: Secondary | ICD-10-CM

## 2014-04-21 DIAGNOSIS — E1165 Type 2 diabetes mellitus with hyperglycemia: Principal | ICD-10-CM

## 2014-04-21 LAB — HEMOGLOBIN A1C: Hgb A1c MFr Bld: 10 % — ABNORMAL HIGH (ref 4.6–6.5)

## 2014-04-21 LAB — GLUCOSE, RANDOM: Glucose, Bld: 194 mg/dL — ABNORMAL HIGH (ref 70–99)

## 2014-04-24 ENCOUNTER — Ambulatory Visit: Payer: Medicare Other | Admitting: Endocrinology

## 2014-05-21 ENCOUNTER — Other Ambulatory Visit: Payer: Self-pay | Admitting: *Deleted

## 2014-05-21 ENCOUNTER — Other Ambulatory Visit: Payer: Medicare Other

## 2014-05-21 DIAGNOSIS — E1165 Type 2 diabetes mellitus with hyperglycemia: Principal | ICD-10-CM

## 2014-05-21 DIAGNOSIS — IMO0001 Reserved for inherently not codable concepts without codable children: Secondary | ICD-10-CM

## 2014-05-21 MED ORDER — METFORMIN HCL 500 MG PO TABS: 2000.0000 mg | ORAL_TABLET | Freq: Every morning | ORAL | Status: DC

## 2014-05-24 IMAGING — CR DG ABDOMEN ACUTE W/ 1V CHEST
5 series · 5 of 5 positions shown · non-contrast
Comparison: DG CHEST 1V PORT dated 09/18/2013

CLINICAL DATA: Generalized abdominal pain

EXAM:
ACUTE ABDOMEN SERIES (ABDOMEN 2 VIEW & CHEST 1 VIEW)

[w chest pa (1 of 2)]
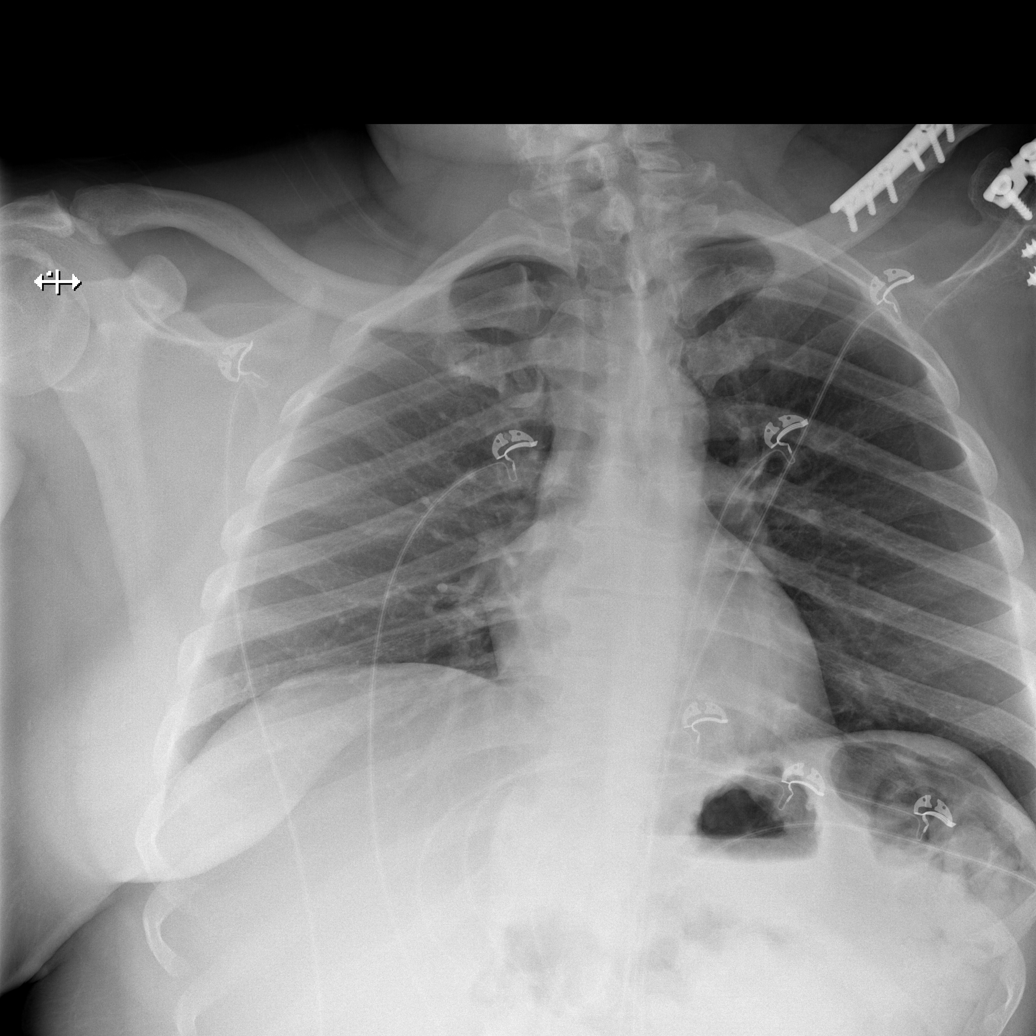

[w chest pa (2 of 2)]
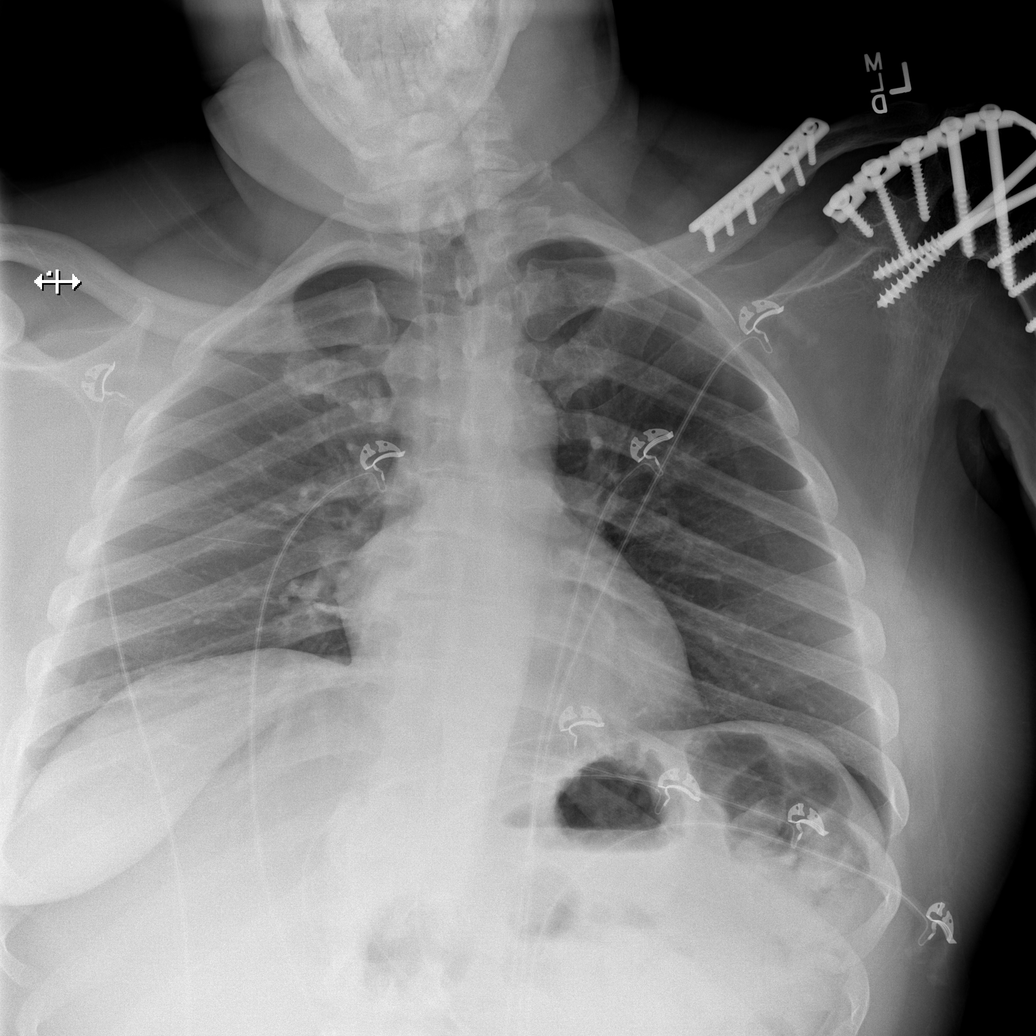

[w abdomen upright]
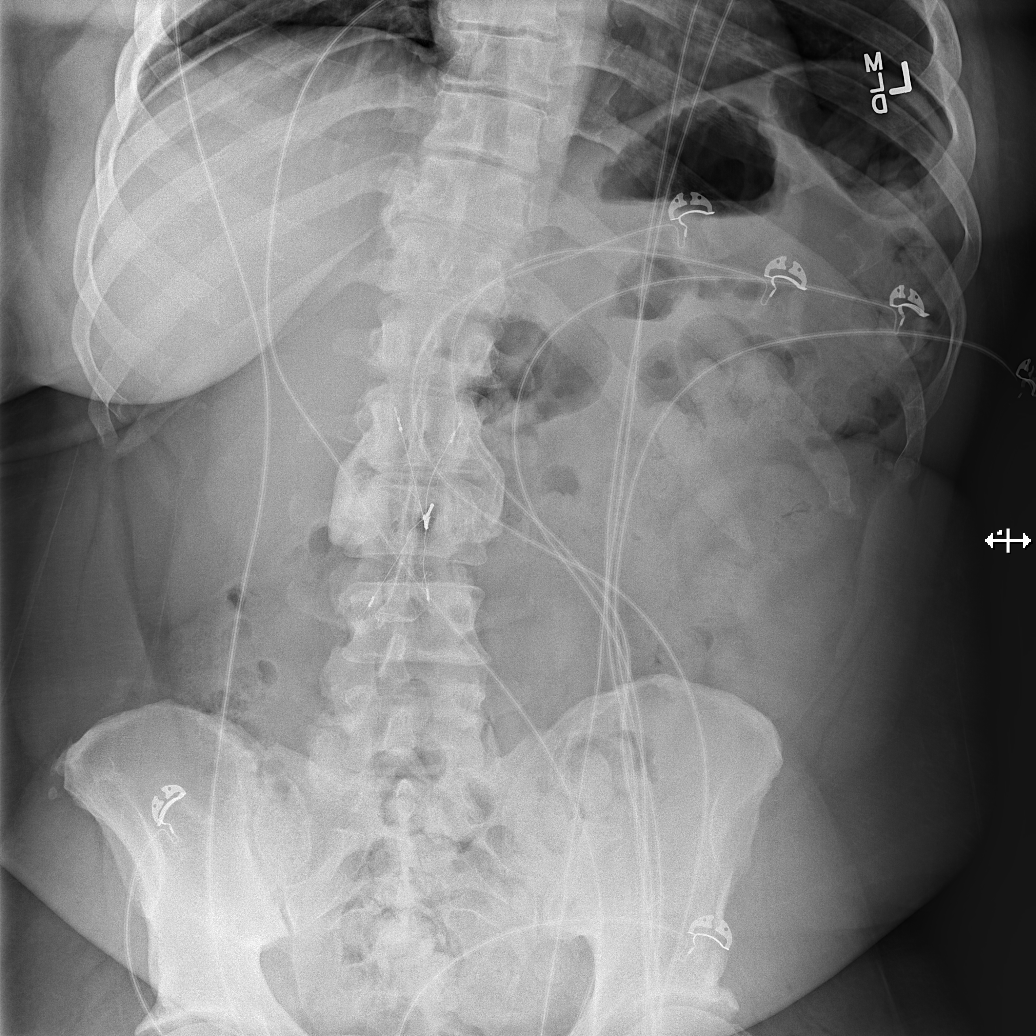

[t abdomen supine (1 of 2)]
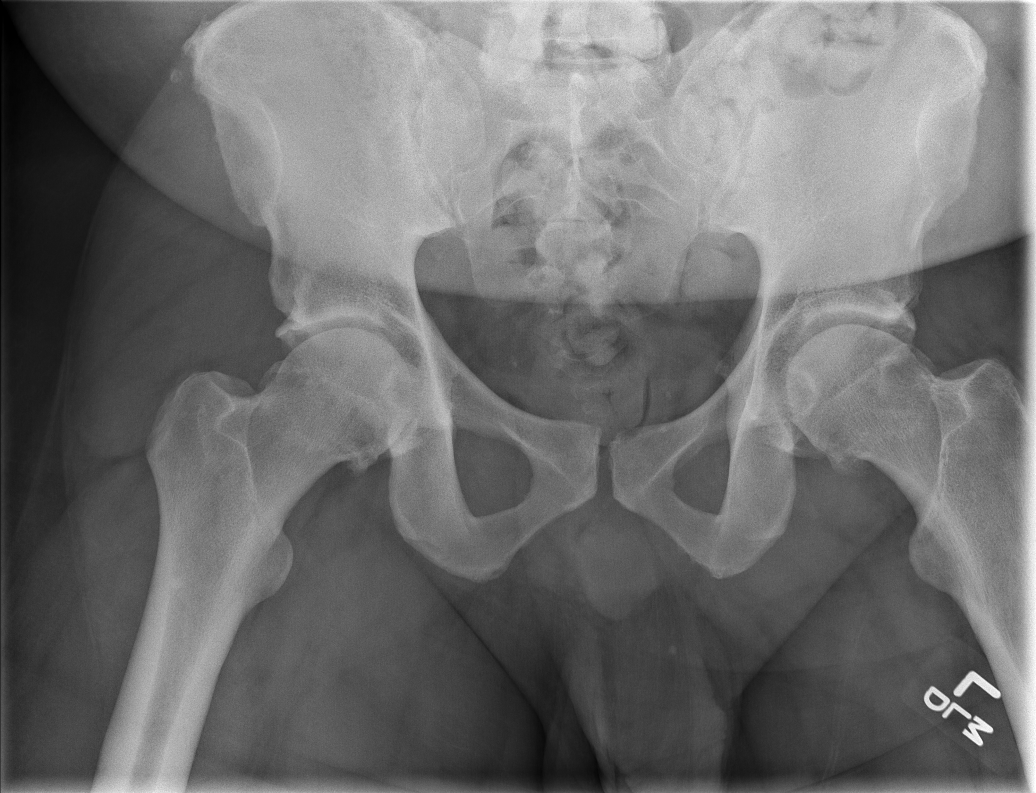

[t abdomen supine (2 of 2)]
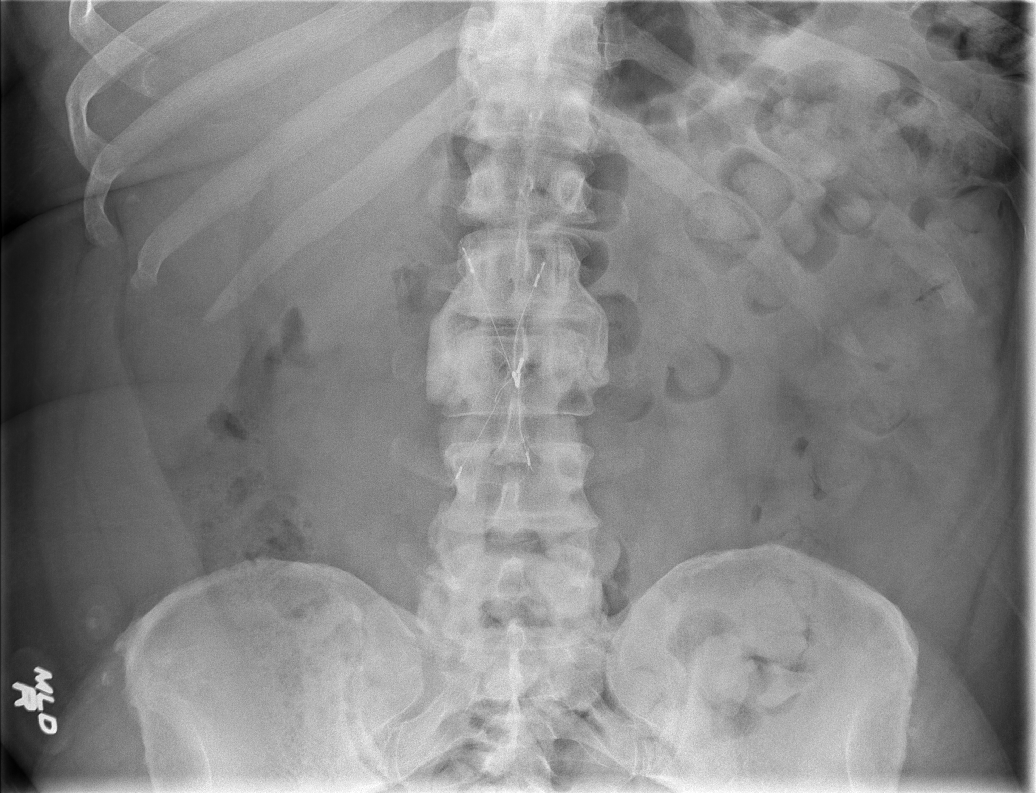

[5 of 5 positions shown; findings below may reference images not displayed]

FINDINGS: Normal cardiac silhouette. Lungs are clear. No free air beneath
hemidiaphragms. Left shoulder internal fixation.

No dilated loops of large or small bowel. There is stool throughout
the colon and rectosigmoid colon. IVC filter noted. No pathologic
calcifications.
IMPRESSION: 1.  No acute cardiopulmonary process.  .
2. No bowel obstruction or intraperitoneal free air.

## 2014-05-26 ENCOUNTER — Ambulatory Visit: Payer: Medicare Other | Admitting: Endocrinology

## 2014-05-26 ENCOUNTER — Other Ambulatory Visit: Payer: Self-pay | Admitting: Internal Medicine

## 2014-05-26 DIAGNOSIS — Z0289 Encounter for other administrative examinations: Secondary | ICD-10-CM

## 2014-06-10 ENCOUNTER — Telehealth: Payer: Self-pay | Admitting: *Deleted

## 2014-06-10 NOTE — Telephone Encounter (Signed)
Left message for patient to call back and schedule appointment.

## 2014-06-20 ENCOUNTER — Other Ambulatory Visit: Payer: Self-pay | Admitting: Internal Medicine

## 2014-07-01 ENCOUNTER — Other Ambulatory Visit: Payer: Self-pay | Admitting: Endocrinology

## 2014-07-03 ENCOUNTER — Ambulatory Visit (INDEPENDENT_AMBULATORY_CARE_PROVIDER_SITE_OTHER): Payer: Medicare Other | Admitting: Internal Medicine

## 2014-07-03 ENCOUNTER — Ambulatory Visit: Payer: Medicare Other | Admitting: Internal Medicine

## 2014-07-03 ENCOUNTER — Encounter: Payer: Self-pay | Admitting: Internal Medicine

## 2014-07-03 VITALS — BP 112/74 | HR 112 | Temp 98.2°F | Resp 20 | Ht 71.0 in | Wt 265.0 lb

## 2014-07-03 DIAGNOSIS — M21372 Foot drop, left foot: Secondary | ICD-10-CM

## 2014-07-03 DIAGNOSIS — K219 Gastro-esophageal reflux disease without esophagitis: Secondary | ICD-10-CM

## 2014-07-03 DIAGNOSIS — M216X9 Other acquired deformities of unspecified foot: Secondary | ICD-10-CM

## 2014-07-03 MED ORDER — ONDANSETRON HCL 4 MG PO TABS
4.0000 mg | ORAL_TABLET | Freq: Three times a day (TID) | ORAL | Status: DC | PRN
Start: 2014-07-03 — End: 2014-12-31

## 2014-07-03 NOTE — Assessment & Plan Note (Signed)
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

## 2014-07-03 NOTE — Progress Notes (Signed)
Pre visit review using our clinic review tool, if applicable. No additional management support is needed unless otherwise documented below in the visit note. 
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
## 2014-07-03 NOTE — Assessment & Plan Note (Signed)
Patient is having some GERD symptoms and advised to take omeprazole 40 mg daily for 2-3 weeks to see if sensation disappears. He is given refill on zofran. If this does not help the sensation other etiologies such as diabetic neuropathy of the stomach and decreased stomach motility likely given poorly controlled diabetes for many years.

## 2014-07-03 NOTE — Patient Instructions (Signed)
For the next two weeks take omeprazole 2 pills in the morning. Use the zofran if you need it. If you are still having problems with the uncomfortable feeling we may need to look into it further.   Stop smoking as this can worsen acid reflux and cause discomfort.   We will fill out your handicapped sticker but will send the student loan paperwork to Dr. Felicity Coyer to look over.   Gastroesophageal Reflux Disease, Adult Gastroesophageal reflux disease (GERD) happens when acid from your stomach flows up into the esophagus. When acid comes in contact with the esophagus, the acid causes soreness (inflammation) in the esophagus. Over time, GERD may create small holes (ulcers) in the lining of the esophagus. CAUSES   Increased body weight. This puts pressure on the stomach, making acid rise from the stomach into the esophagus.  Smoking. This increases acid production in the stomach.  Drinking alcohol. This causes decreased pressure in the lower esophageal sphincter (valve or ring of muscle between the esophagus and stomach), allowing acid from the stomach into the esophagus.  Late evening meals and a full stomach. This increases pressure and acid production in the stomach.  A malformed lower esophageal sphincter. Sometimes, no cause is found. SYMPTOMS   Burning pain in the lower part of the mid-chest behind the breastbone and in the mid-stomach area. This may occur twice a week or more often.  Trouble swallowing.  Sore throat.  Dry cough.  Asthma-like symptoms including chest tightness, shortness of breath, or wheezing. DIAGNOSIS  Your caregiver may be able to diagnose GERD based on your symptoms. In some cases, X-rays and other tests may be done to check for complications or to check the condition of your stomach and esophagus. TREATMENT  Your caregiver may recommend over-the-counter or prescription medicines to help decrease acid production. Ask your caregiver before starting or adding any  new medicines.  HOME CARE INSTRUCTIONS   Change the factors that you can control. Ask your caregiver for guidance concerning weight loss, quitting smoking, and alcohol consumption.  Avoid foods and drinks that make your symptoms worse, such as:  Caffeine or alcoholic drinks.  Chocolate.  Peppermint or mint flavorings.  Garlic and onions.  Spicy foods.  Citrus fruits, such as oranges, lemons, or limes.  Tomato-based foods such as sauce, chili, salsa, and pizza.  Fried and fatty foods.  Avoid lying down for the 3 hours prior to your bedtime or prior to taking a nap.  Eat small, frequent meals instead of large meals.  Wear loose-fitting clothing. Do not wear anything tight around your waist that causes pressure on your stomach.  Raise the head of your bed 6 to 8 inches with wood blocks to help you sleep. Extra pillows will not help.  Only take over-the-counter or prescription medicines for pain, discomfort, or fever as directed by your caregiver.  Do not take aspirin, ibuprofen, or other nonsteroidal anti-inflammatory drugs (NSAIDs). SEEK IMMEDIATE MEDICAL CARE IF:   You have pain in your arms, neck, jaw, teeth, or back.  Your pain increases or changes in intensity or duration.  You develop nausea, vomiting, or sweating (diaphoresis).  You develop shortness of breath, or you faint.  Your vomit is green, yellow, black, or looks like coffee grounds or blood.  Your stool is red, bloody, or black. These symptoms could be signs of other problems, such as heart disease, gastric bleeding, or esophageal bleeding. MAKE SURE YOU:   Understand these instructions.  Will watch your condition.  Will get help right away if you are not doing well or get worse. Document Released: 07/20/2005 Document Revised: 01/02/2012 Document Reviewed: 04/29/2011 [facility] Patient Information 2015 [place], [place]. This information is not intended to replace advice given to you by your health care  provider. Make sure you discuss any questions you have with your health care provider.

## 2014-08-22 ENCOUNTER — Other Ambulatory Visit: Payer: Self-pay | Admitting: *Deleted

## 2014-08-22 ENCOUNTER — Telehealth: Payer: Self-pay | Admitting: Endocrinology

## 2014-08-22 MED ORDER — INSULIN LISPRO 100 UNIT/ML CARTRIDGE: SUBCUTANEOUS | Status: DC

## 2014-08-22 MED ORDER — INSULIN NPH (HUMAN) (ISOPHANE) 100 UNIT/ML ~~LOC~~ SUSP: SUBCUTANEOUS | Status: DC

## 2014-08-22 NOTE — Telephone Encounter (Signed)
rx sent for NPH and humalog, Per Dr. Lucianne MussKumar patient was c/o cost of levemir and he switched him to NPH.

## 2014-08-22 NOTE — Telephone Encounter (Signed)
Humulin N and Humalog and levemir needs to be called into [facility]

## 2014-08-27 ENCOUNTER — Other Ambulatory Visit: Payer: Self-pay | Admitting: *Deleted

## 2014-09-04 ENCOUNTER — Other Ambulatory Visit: Payer: Medicare Other

## 2014-09-09 ENCOUNTER — Ambulatory Visit: Payer: Medicare Other | Admitting: Endocrinology

## 2014-09-09 ENCOUNTER — Other Ambulatory Visit (INDEPENDENT_AMBULATORY_CARE_PROVIDER_SITE_OTHER): Payer: Medicare Other

## 2014-09-09 DIAGNOSIS — E1165 Type 2 diabetes mellitus with hyperglycemia: Secondary | ICD-10-CM

## 2014-09-09 DIAGNOSIS — IMO0002 Reserved for concepts with insufficient information to code with codable children: Secondary | ICD-10-CM

## 2014-09-10 LAB — LIPID PANEL
Cholesterol: 181 mg/dL (ref 0–200)
HDL: 42.7 mg/dL (ref >39.00)
LDL CALC: 103 mg/dL — AB (ref 0–99)
NonHDL: 138.3
Total CHOL/HDL Ratio: 4
Triglycerides: 178 mg/dL — ABNORMAL HIGH (ref 0.0–149.0)
VLDL: 35.6 mg/dL (ref 0.0–40.0)

## 2014-09-10 LAB — COMPREHENSIVE METABOLIC PANEL
ALT: 49 U/L (ref 0–53)
AST: 32 U/L (ref 0–37)
Albumin: 4.3 g/dL (ref 3.5–5.2)
Alkaline Phosphatase: 56 U/L (ref 39–117)
BUN: 15 mg/dL (ref 6–23)
CO2: 33 mEq/L — ABNORMAL HIGH (ref 19–32)
CREATININE: 0.8 mg/dL (ref 0.4–1.5)
Calcium: 9.4 mg/dL (ref 8.4–10.5)
Chloride: 98 mEq/L (ref 96–112)
GFR: 106.11 mL/min (ref >60.00)
Glucose, Bld: 131 mg/dL — ABNORMAL HIGH (ref 70–99)
Potassium: 4.7 mEq/L (ref 3.5–5.1)
Sodium: 137 mEq/L (ref 135–145)
Total Bilirubin: 0.4 mg/dL (ref 0.2–1.2)
Total Protein: 7.9 g/dL (ref 6.0–8.3)

## 2014-09-10 LAB — FRUCTOSAMINE: Fructosamine: 470 umol/L — ABNORMAL HIGH (ref 0–285)

## 2014-09-12 ENCOUNTER — Other Ambulatory Visit: Payer: Self-pay | Admitting: *Deleted

## 2014-09-12 ENCOUNTER — Encounter: Payer: Self-pay | Admitting: Endocrinology

## 2014-09-12 ENCOUNTER — Ambulatory Visit (INDEPENDENT_AMBULATORY_CARE_PROVIDER_SITE_OTHER): Payer: Medicare Other | Admitting: Endocrinology

## 2014-09-12 VITALS — BP 155/98 | HR 98 | Temp 98.0°F | Resp 16 | Ht 71.0 in | Wt 272.0 lb

## 2014-09-12 DIAGNOSIS — E291 Testicular hypofunction: Secondary | ICD-10-CM

## 2014-09-12 DIAGNOSIS — E7800 Pure hypercholesterolemia, unspecified: Secondary | ICD-10-CM

## 2014-09-12 DIAGNOSIS — Z23 Encounter for immunization: Secondary | ICD-10-CM

## 2014-09-12 DIAGNOSIS — IMO0002 Reserved for concepts with insufficient information to code with codable children: Secondary | ICD-10-CM

## 2014-09-12 DIAGNOSIS — I10 Essential (primary) hypertension: Secondary | ICD-10-CM

## 2014-09-12 DIAGNOSIS — E1165 Type 2 diabetes mellitus with hyperglycemia: Secondary | ICD-10-CM

## 2014-09-12 MED ORDER — TESTOSTERONE CYPIONATE 200 MG/ML IM SOLN: INTRAMUSCULAR | Status: DC

## 2014-09-12 MED ORDER — SILDENAFIL CITRATE 100 MG PO TABS: 100.0000 mg | ORAL_TABLET | Freq: Every day | ORAL | Status: DC | PRN

## 2014-09-12 NOTE — Patient Instructions (Addendum)
NPH 30 units on waking up and at bedtime   Humalog 16 for small meals and 26 for larger meals just before eating  Please check blood sugars at least half the time about 2 hours after any meal and times per week on waking up. Please bring blood sugar monitor to each visit
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
## 2014-09-17 ENCOUNTER — Other Ambulatory Visit: Payer: Self-pay | Admitting: Endocrinology

## 2014-11-12 ENCOUNTER — Telehealth: Payer: Self-pay | Admitting: Endocrinology

## 2014-11-12 ENCOUNTER — Ambulatory Visit: Payer: Medicare Other | Admitting: Endocrinology

## 2014-11-12 NOTE — Telephone Encounter (Signed)
Patient no showed today's appt. Please advise on how to follow up. °A. No follow up necessary. °B. Follow up urgent. Contact patient immediately. °C. Follow up necessary. Contact patient and schedule visit in ___ days. °D. Follow up advised. Contact patient and schedule visit in ____weeks. ° °

## 2014-11-13 NOTE — Telephone Encounter (Signed)
Needs to be rescheduled for the first available appointment 

## 2014-12-07 ENCOUNTER — Other Ambulatory Visit: Payer: Self-pay | Admitting: Internal Medicine

## 2014-12-08 ENCOUNTER — Other Ambulatory Visit: Payer: Self-pay | Admitting: Endocrinology

## 2014-12-16 ENCOUNTER — Other Ambulatory Visit: Payer: Self-pay | Admitting: Endocrinology

## 2014-12-31 ENCOUNTER — Other Ambulatory Visit: Payer: Self-pay | Admitting: Internal Medicine

## 2015-01-05 ENCOUNTER — Other Ambulatory Visit: Payer: Medicare Other

## 2015-01-07 ENCOUNTER — Other Ambulatory Visit (INDEPENDENT_AMBULATORY_CARE_PROVIDER_SITE_OTHER): Payer: Medicare Other

## 2015-01-07 DIAGNOSIS — E1165 Type 2 diabetes mellitus with hyperglycemia: Secondary | ICD-10-CM

## 2015-01-07 DIAGNOSIS — IMO0002 Reserved for concepts with insufficient information to code with codable children: Secondary | ICD-10-CM

## 2015-01-07 LAB — URINALYSIS, ROUTINE W REFLEX MICROSCOPIC
HGB URINE DIPSTICK: NEGATIVE
LEUKOCYTES UA: NEGATIVE
NITRITE: NEGATIVE
Specific Gravity, Urine: >=1.03 — AB (ref 1.000–1.030)
Urine Glucose: NEGATIVE
Urobilinogen, UA: 0.2 (ref 0.0–1.0)
pH: 6 (ref 5.0–8.0)

## 2015-01-07 LAB — MICROALBUMIN / CREATININE URINE RATIO
CREATININE, U: 361.8 mg/dL
Microalb Creat Ratio: 0.7 mg/g (ref 0.0–30.0)
Microalb, Ur: 2.5 mg/dL — ABNORMAL HIGH (ref 0.0–1.9)

## 2015-01-07 LAB — BASIC METABOLIC PANEL
BUN: 13 mg/dL (ref 6–23)
CO2: 31 mEq/L (ref 19–32)
Calcium: 9.2 mg/dL (ref 8.4–10.5)
Chloride: 99 mEq/L (ref 96–112)
Creatinine, Ser: 0.89 mg/dL (ref 0.40–1.50)
GFR: 97.76 mL/min (ref >60.00)
GLUCOSE: 171 mg/dL — AB (ref 70–99)
POTASSIUM: 4.1 meq/L (ref 3.5–5.1)
Sodium: 136 mEq/L (ref 135–145)

## 2015-01-07 LAB — HEMOGLOBIN A1C: HEMOGLOBIN A1C: 12 % — AB (ref 4.6–6.5)

## 2015-01-08 ENCOUNTER — Ambulatory Visit (INDEPENDENT_AMBULATORY_CARE_PROVIDER_SITE_OTHER): Payer: Medicare Other | Admitting: Endocrinology

## 2015-01-08 ENCOUNTER — Encounter: Payer: Self-pay | Admitting: Endocrinology

## 2015-01-08 VITALS — BP 122/80 | HR 106 | Temp 97.6°F | Ht 71.0 in | Wt 267.0 lb

## 2015-01-08 DIAGNOSIS — E1165 Type 2 diabetes mellitus with hyperglycemia: Secondary | ICD-10-CM

## 2015-01-08 DIAGNOSIS — E291 Testicular hypofunction: Secondary | ICD-10-CM

## 2015-01-08 DIAGNOSIS — IMO0002 Reserved for concepts with insufficient information to code with codable children: Secondary | ICD-10-CM

## 2015-01-08 DIAGNOSIS — I10 Essential (primary) hypertension: Secondary | ICD-10-CM

## 2015-01-08 MED ORDER — VICTOZA 18 MG/3ML ~~LOC~~ SOPN: 1.2000 mg | PEN_INJECTOR | Freq: Every day | SUBCUTANEOUS | Status: DC
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
## 2015-01-08 NOTE — Patient Instructions (Addendum)
Humalog to be reduced at least 4-6 units with starting Victoza  Start VICTOZA injection as shown once daily at the same time of the day.  Dial the dose to 0.6 mg on the pen for the first week.  You may inject in the stomach, thigh or arm. You may experience nausea in the first few days which usually goes away.  You will feel fullness of the stomach with starting the medication and should try to keep the portions at meals small. After 1 week increase the dose to 1.2mg  daily if no nausea present.   If any questions or concerns are present call the office or the Victoza Care helpline at [phone]. Visit [email or web address] for more useful information  Walk daily  Check on cost of TOUJEO   Please check blood sugars at least half the time about 2 hours after any meal and 3 times per week on waking up. Please bring blood sugar monitor to each visit. Recommended blood sugar levels about 2 hours after meal is 140-180 and on waking up 90-130

## 2015-01-14 ENCOUNTER — Telehealth: Payer: Self-pay | Admitting: Endocrinology

## 2015-01-14 NOTE — Telephone Encounter (Signed)
[place] [facility] [phone]  Ref [id number]--please call Judithann Saugerporsha so she can ask you PA questions

## 2015-02-08 ENCOUNTER — Other Ambulatory Visit: Payer: Self-pay | Admitting: Internal Medicine

## 2015-02-09 ENCOUNTER — Ambulatory Visit: Payer: Medicare Other | Admitting: Nutrition

## 2015-02-19 ENCOUNTER — Encounter: Payer: Self-pay | Admitting: Endocrinology

## 2015-02-19 ENCOUNTER — Encounter: Payer: Medicare Other | Admitting: Endocrinology

## 2015-02-19 NOTE — Progress Notes (Signed)
This encounter was created in error - please disregard.

## 2015-02-20 LAB — COMPREHENSIVE METABOLIC PANEL
ALT: 30 U/L (ref 0–53)
AST: 23 U/L (ref 0–37)
Albumin: 4.3 g/dL (ref 3.5–5.2)
Alkaline Phosphatase: 50 U/L (ref 39–117)
BILIRUBIN TOTAL: 0.3 mg/dL (ref 0.2–1.2)
BUN: 14 mg/dL (ref 6–23)
CALCIUM: 9.7 mg/dL (ref 8.4–10.5)
CHLORIDE: 98 meq/L (ref 96–112)
CO2: 30 mEq/L (ref 19–32)
CREATININE: 0.94 mg/dL (ref 0.40–1.50)
GFR: 91.74 mL/min (ref >60.00)
GLUCOSE: 132 mg/dL — AB (ref 70–99)
Potassium: 4.3 mEq/L (ref 3.5–5.1)
SODIUM: 136 meq/L (ref 135–145)
TOTAL PROTEIN: 7.8 g/dL (ref 6.0–8.3)

## 2015-02-20 LAB — TESTOSTERONE: TESTOSTERONE: 90.15 ng/dL — AB (ref 300.00–890.00)

## 2015-02-25 LAB — FRUCTOSAMINE: Fructosamine: 352 umol/L — ABNORMAL HIGH (ref 190–270)

## 2015-02-26 ENCOUNTER — Other Ambulatory Visit: Payer: Self-pay | Admitting: *Deleted

## 2015-02-26 ENCOUNTER — Encounter: Payer: Self-pay | Admitting: Endocrinology

## 2015-02-26 ENCOUNTER — Ambulatory Visit (INDEPENDENT_AMBULATORY_CARE_PROVIDER_SITE_OTHER): Payer: Medicare Other | Admitting: Endocrinology

## 2015-02-26 VITALS — BP 128/72 | HR 115 | Temp 98.1°F | Resp 14 | Ht 71.0 in | Wt 273.4 lb

## 2015-02-26 DIAGNOSIS — I10 Essential (primary) hypertension: Secondary | ICD-10-CM

## 2015-02-26 DIAGNOSIS — E291 Testicular hypofunction: Secondary | ICD-10-CM | POA: Diagnosis not present

## 2015-02-26 DIAGNOSIS — E1165 Type 2 diabetes mellitus with hyperglycemia: Secondary | ICD-10-CM | POA: Diagnosis not present

## 2015-02-26 DIAGNOSIS — IMO0002 Reserved for concepts with insufficient information to code with codable children: Secondary | ICD-10-CM

## 2015-02-26 MED ORDER — TESTOSTERONE CYPIONATE 200 MG/ML IM SOLN: INTRAMUSCULAR | Status: DC

## 2015-02-26 NOTE — Patient Instructions (Addendum)
Please check blood sugars at least half the time about 2 hours after any meal and 3 times per week on waking up. Please bring blood sugar monitor to each visit. Recommended blood sugar levels about 2 hours after meal is 140-180 and on waking up 90-130  Victoza 0.6 for 4 days then 1.2  Take Humulin N in am and at bedtime regularly  Adjust regular insulin dose based on meal size  Walk daily  Check cost of Testopel pellets thru insurance or get 3 mths supply
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
## 2015-03-30 ENCOUNTER — Other Ambulatory Visit: Payer: Self-pay | Admitting: Internal Medicine

## 2015-03-30 ENCOUNTER — Other Ambulatory Visit: Payer: Self-pay | Admitting: Endocrinology

## 2015-03-31 ENCOUNTER — Ambulatory Visit: Payer: Medicare Other | Admitting: Nutrition

## 2015-04-29 ENCOUNTER — Other Ambulatory Visit (INDEPENDENT_AMBULATORY_CARE_PROVIDER_SITE_OTHER): Payer: Medicare Other

## 2015-04-29 DIAGNOSIS — E1165 Type 2 diabetes mellitus with hyperglycemia: Secondary | ICD-10-CM

## 2015-04-29 DIAGNOSIS — IMO0002 Reserved for concepts with insufficient information to code with codable children: Secondary | ICD-10-CM

## 2015-04-29 DIAGNOSIS — E291 Testicular hypofunction: Secondary | ICD-10-CM

## 2015-04-29 LAB — BASIC METABOLIC PANEL
BUN: 15 mg/dL (ref 6–23)
CALCIUM: 9.1 mg/dL (ref 8.4–10.5)
CO2: 27 meq/L (ref 19–32)
CREATININE: 0.9 mg/dL (ref 0.40–1.50)
Chloride: 97 mEq/L (ref 96–112)
GFR: 96.38 mL/min (ref >60.00)
GLUCOSE: 385 mg/dL — AB (ref 70–99)
Potassium: 4.1 mEq/L (ref 3.5–5.1)
Sodium: 134 mEq/L — ABNORMAL LOW (ref 135–145)

## 2015-04-29 LAB — HEMOGLOBIN A1C: HEMOGLOBIN A1C: 10.4 % — AB (ref 4.6–6.5)

## 2015-04-29 LAB — TESTOSTERONE: Testosterone: 40.89 ng/dL — ABNORMAL LOW (ref 300.00–890.00)

## 2015-04-30 ENCOUNTER — Ambulatory Visit: Payer: Medicare Other | Admitting: Endocrinology

## 2015-04-30 NOTE — Progress Notes (Signed)
Quick Note:  Please let patient know that the his blood sugar was 385 He needs to check his sugars 3-4 times a day in the week before his scheduled office visit. Will need to see his meter on his visit Also testosterone level very low and needs to start back on injections  ______

## 2015-05-01 ENCOUNTER — Ambulatory Visit: Payer: Medicare Other | Admitting: Endocrinology

## 2015-05-15 ENCOUNTER — Other Ambulatory Visit: Payer: Self-pay | Admitting: Endocrinology

## 2015-05-21 ENCOUNTER — Other Ambulatory Visit: Payer: Medicare Other

## 2015-05-27 ENCOUNTER — Ambulatory Visit: Payer: Medicare Other | Admitting: Endocrinology

## 2015-06-21 ENCOUNTER — Other Ambulatory Visit: Payer: Self-pay | Admitting: Endocrinology

## 2015-06-26 ENCOUNTER — Other Ambulatory Visit: Payer: Self-pay | Admitting: *Deleted

## 2015-06-26 ENCOUNTER — Other Ambulatory Visit: Payer: Self-pay | Admitting: Internal Medicine

## 2015-06-26 MED ORDER — METFORMIN HCL 500 MG PO TABS: ORAL_TABLET | ORAL | Status: DC

## 2015-06-26 MED ORDER — LISINOPRIL-HYDROCHLOROTHIAZIDE 20-12.5 MG PO TABS: 1.0000 | ORAL_TABLET | Freq: Every day | ORAL | Status: DC

## 2015-06-26 MED ORDER — AMLODIPINE BESYLATE 5 MG PO TABS: 5.0000 mg | ORAL_TABLET | Freq: Every day | ORAL | Status: DC

## 2015-08-08 ENCOUNTER — Other Ambulatory Visit: Payer: Self-pay | Admitting: Internal Medicine

## 2015-09-01 ENCOUNTER — Emergency Department (HOSPITAL_COMMUNITY)
Admission: EM | Admit: 2015-09-01 | Discharge: 2015-09-01 | Disposition: A | Discharge status: Home / Self Care | Payer: Medicare Other | Attending: Emergency Medicine | Admitting: Emergency Medicine

## 2015-09-01 ENCOUNTER — Encounter (HOSPITAL_COMMUNITY): Payer: Self-pay | Admitting: Emergency Medicine

## 2015-09-01 DIAGNOSIS — Z720 Tobacco use: Secondary | ICD-10-CM | POA: Insufficient documentation

## 2015-09-01 DIAGNOSIS — R0602 Shortness of breath: Secondary | ICD-10-CM | POA: Diagnosis not present

## 2015-09-01 DIAGNOSIS — I10 Essential (primary) hypertension: Secondary | ICD-10-CM | POA: Insufficient documentation

## 2015-09-01 DIAGNOSIS — Z79899 Other long term (current) drug therapy: Secondary | ICD-10-CM | POA: Diagnosis not present

## 2015-09-01 DIAGNOSIS — E1165 Type 2 diabetes mellitus with hyperglycemia: Secondary | ICD-10-CM | POA: Insufficient documentation

## 2015-09-01 DIAGNOSIS — R55 Syncope and collapse: Secondary | ICD-10-CM | POA: Diagnosis not present

## 2015-09-01 DIAGNOSIS — Z794 Long term (current) use of insulin: Secondary | ICD-10-CM | POA: Diagnosis not present

## 2015-09-01 DIAGNOSIS — G8929 Other chronic pain: Secondary | ICD-10-CM | POA: Insufficient documentation

## 2015-09-01 DIAGNOSIS — R739 Hyperglycemia, unspecified: Secondary | ICD-10-CM

## 2015-09-01 LAB — BASIC METABOLIC PANEL
ANION GAP: 10 (ref 5–15)
BUN: 11 mg/dL (ref 6–20)
CO2: 28 mmol/L (ref 22–32)
Calcium: 9.1 mg/dL (ref 8.9–10.3)
Chloride: 98 mmol/L — ABNORMAL LOW (ref 101–111)
Creatinine, Ser: 0.9 mg/dL (ref 0.61–1.24)
GLUCOSE: 249 mg/dL — AB (ref 65–99)
POTASSIUM: 4.1 mmol/L (ref 3.5–5.1)
Sodium: 136 mmol/L (ref 135–145)

## 2015-09-01 LAB — CBC
HEMATOCRIT: 46.7 % (ref 39.0–52.0)
HEMOGLOBIN: 15 g/dL (ref 13.0–17.0)
MCH: 25.5 pg — ABNORMAL LOW (ref 26.0–34.0)
MCHC: 32.1 g/dL (ref 30.0–36.0)
MCV: 79.3 fL (ref 78.0–100.0)
Platelets: 207 10*3/uL (ref 150–400)
RBC: 5.89 MIL/uL — AB (ref 4.22–5.81)
RDW: 15 % (ref 11.5–15.5)
WBC: 8.3 10*3/uL (ref 4.0–10.5)

## 2015-09-01 LAB — CBG MONITORING, ED: Glucose-Capillary: 218 mg/dL — ABNORMAL HIGH (ref 65–99)

## 2015-09-01 MED ORDER — SODIUM CHLORIDE 0.9 % IV BOLUS (SEPSIS)
1000.0000 mL | Freq: Once | INTRAVENOUS | Status: AC
  Administered 2015-09-01: 1000 mL via INTRAVENOUS

## 2015-09-01 MED ORDER — FENTANYL CITRATE (PF) 100 MCG/2ML IJ SOLN
100.0000 ug | Freq: Once | INTRAMUSCULAR | Status: AC
  Administered 2015-09-01: 100 ug via INTRAVENOUS
  Filled 2015-09-01: qty 2

## 2015-09-01 NOTE — ED Notes (Signed)
Ambulated patient in the hallway. Patient stated that he felt better and walking wasn't making him dizzy nor tired. Status was report to Dr. Bebe ShaggyWickline..Marland Kitchen

## 2015-09-01 NOTE — ED Notes (Signed)
EMS states pt had LOC/syncope at home when he got up to urinate. Per EMS, pt was found by family on floor of bathroom and was also diaphoretic. EMS states pt was alert and oriented x 4 otw to hospital. Pt is diabetic with CBG by EMS as 212, denies cardiac hx. Pt has 18G IV left hand that is saline locked.
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
## 2015-10-02 ENCOUNTER — Other Ambulatory Visit: Payer: Self-pay | Admitting: *Deleted

## 2015-10-02 MED ORDER — TESTOSTERONE CYPIONATE 200 MG/ML IM SOLN: INTRAMUSCULAR | Status: DC

## 2015-10-05 ENCOUNTER — Other Ambulatory Visit: Payer: Medicare Other

## 2015-10-05 ENCOUNTER — Other Ambulatory Visit: Payer: Self-pay | Admitting: *Deleted

## 2015-10-05 ENCOUNTER — Other Ambulatory Visit (INDEPENDENT_AMBULATORY_CARE_PROVIDER_SITE_OTHER): Payer: Medicare Other

## 2015-10-05 DIAGNOSIS — IMO0002 Reserved for concepts with insufficient information to code with codable children: Secondary | ICD-10-CM

## 2015-10-05 DIAGNOSIS — E1165 Type 2 diabetes mellitus with hyperglycemia: Secondary | ICD-10-CM

## 2015-10-05 DIAGNOSIS — E118 Type 2 diabetes mellitus with unspecified complications: Secondary | ICD-10-CM | POA: Diagnosis not present

## 2015-10-05 DIAGNOSIS — E291 Testicular hypofunction: Secondary | ICD-10-CM

## 2015-10-05 LAB — BASIC METABOLIC PANEL
BUN: 19 mg/dL (ref 6–23)
CALCIUM: 9.4 mg/dL (ref 8.4–10.5)
CO2: 28 meq/L (ref 19–32)
CREATININE: 0.87 mg/dL (ref 0.40–1.50)
Chloride: 98 mEq/L (ref 96–112)
GFR: 121.04 mL/min (ref >60.00)
GLUCOSE: 219 mg/dL — AB (ref 70–99)
Potassium: 4.2 mEq/L (ref 3.5–5.1)
Sodium: 134 mEq/L — ABNORMAL LOW (ref 135–145)

## 2015-10-05 LAB — TESTOSTERONE: Testosterone: 46.95 ng/dL — ABNORMAL LOW (ref 300.00–890.00)

## 2015-10-05 LAB — HEMOGLOBIN A1C: Hgb A1c MFr Bld: 11.2 % — ABNORMAL HIGH (ref 4.6–6.5)

## 2015-10-07 ENCOUNTER — Encounter: Payer: Self-pay | Admitting: Endocrinology

## 2015-10-07 ENCOUNTER — Ambulatory Visit (INDEPENDENT_AMBULATORY_CARE_PROVIDER_SITE_OTHER): Payer: Medicare Other | Admitting: Endocrinology

## 2015-10-07 ENCOUNTER — Other Ambulatory Visit (INDEPENDENT_AMBULATORY_CARE_PROVIDER_SITE_OTHER): Payer: Medicare Other

## 2015-10-07 VITALS — BP 126/72 | HR 115 | Temp 98.3°F | Resp 16 | Ht 71.0 in | Wt 264.0 lb

## 2015-10-07 DIAGNOSIS — E291 Testicular hypofunction: Secondary | ICD-10-CM | POA: Diagnosis not present

## 2015-10-07 DIAGNOSIS — R634 Abnormal weight loss: Secondary | ICD-10-CM

## 2015-10-07 DIAGNOSIS — E1165 Type 2 diabetes mellitus with hyperglycemia: Secondary | ICD-10-CM | POA: Diagnosis not present

## 2015-10-07 DIAGNOSIS — Z794 Long term (current) use of insulin: Secondary | ICD-10-CM | POA: Diagnosis not present

## 2015-10-07 DIAGNOSIS — Z23 Encounter for immunization: Secondary | ICD-10-CM | POA: Diagnosis not present

## 2015-10-07 DIAGNOSIS — I10 Essential (primary) hypertension: Secondary | ICD-10-CM | POA: Diagnosis not present
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
## 2015-10-07 NOTE — Patient Instructions (Addendum)
Check blood sugars on waking up 4  times a week Also check blood sugars about 2 hours after a meal and do this after different meals by rotation  Recommended blood sugar levels on waking up is 90-130 and about 2 hours after meal is 130-160  Please bring your blood sugar monitor to each visit, thank you  Take N  insulin consistently at breakfast (35 units) and bedtime (50 units)  Victoza 0.6mg  daily   Keep food diary for [employer]  Keep Humalog at each meal  Less snacks  Walk daily

## 2015-10-08 LAB — T4, FREE: Free T4: 0.81 ng/dL (ref 0.60–1.60)

## 2015-10-08 LAB — TSH: TSH: 1.7 u[IU]/mL (ref 0.35–4.50)

## 2015-10-11 ENCOUNTER — Other Ambulatory Visit: Payer: Self-pay | Admitting: Endocrinology

## 2015-10-21 ENCOUNTER — Encounter: Payer: Medicare Other | Admitting: Nutrition

## 2015-11-11 ENCOUNTER — Ambulatory Visit: Payer: Medicare Other | Admitting: Endocrinology

## 2015-11-24 ENCOUNTER — Telehealth: Payer: Self-pay | Admitting: Internal Medicine

## 2015-11-24 NOTE — Telephone Encounter (Signed)
Patient has an appointment, 12/14/15 and would like his labs order put in he will be going over to [facility] to get them drawn.

## 2015-11-24 NOTE — Telephone Encounter (Signed)
This is your patient.

## 2015-11-25 ENCOUNTER — Ambulatory Visit: Payer: Medicare Other | Admitting: Endocrinology

## 2015-11-25 ENCOUNTER — Other Ambulatory Visit: Payer: Self-pay | Admitting: *Deleted

## 2015-11-25 DIAGNOSIS — E1165 Type 2 diabetes mellitus with hyperglycemia: Principal | ICD-10-CM

## 2015-11-25 DIAGNOSIS — IMO0001 Reserved for inherently not codable concepts without codable children: Secondary | ICD-10-CM

## 2015-11-25 NOTE — Telephone Encounter (Signed)
CMP, lipid, fructosamine from [facility]

## 2015-11-25 NOTE — Telephone Encounter (Signed)
Noted, labs ordered.

## 2015-11-25 NOTE — Telephone Encounter (Signed)
He had labs done on 12/14, is there anything you want drawn on him?  Please advise.

## 2015-12-11 ENCOUNTER — Other Ambulatory Visit (INDEPENDENT_AMBULATORY_CARE_PROVIDER_SITE_OTHER): Payer: Medicare Other

## 2015-12-11 ENCOUNTER — Other Ambulatory Visit: Payer: Self-pay | Admitting: *Deleted

## 2015-12-11 DIAGNOSIS — IMO0001 Reserved for inherently not codable concepts without codable children: Secondary | ICD-10-CM

## 2015-12-11 DIAGNOSIS — E1165 Type 2 diabetes mellitus with hyperglycemia: Secondary | ICD-10-CM

## 2015-12-11 LAB — COMPREHENSIVE METABOLIC PANEL
ALBUMIN: 4.3 g/dL (ref 3.5–5.2)
ALT: 38 U/L (ref 0–53)
AST: 22 U/L (ref 0–37)
Alkaline Phosphatase: 56 U/L (ref 39–117)
BUN: 16 mg/dL (ref 6–23)
CHLORIDE: 97 meq/L (ref 96–112)
CO2: 32 meq/L (ref 19–32)
Calcium: 9.3 mg/dL (ref 8.4–10.5)
Creatinine, Ser: 1.04 mg/dL (ref 0.40–1.50)
GFR: 98.43 mL/min (ref >60.00)
Glucose, Bld: 403 mg/dL — ABNORMAL HIGH (ref 70–99)
POTASSIUM: 4.4 meq/L (ref 3.5–5.1)
SODIUM: 135 meq/L (ref 135–145)
Total Bilirubin: 0.2 mg/dL (ref 0.2–1.2)
Total Protein: 7.7 g/dL (ref 6.0–8.3)

## 2015-12-11 LAB — LIPID PANEL
CHOLESTEROL: 162 mg/dL (ref 0–200)
HDL: 42.6 mg/dL (ref >39.00)
LDL CALC: 81 mg/dL (ref 0–99)
NonHDL: 119.47
Total CHOL/HDL Ratio: 4
Triglycerides: 191 mg/dL — ABNORMAL HIGH (ref 0.0–149.0)
VLDL: 38.2 mg/dL (ref 0.0–40.0)

## 2015-12-11 MED ORDER — TESTOSTERONE CYPIONATE 200 MG/ML IM SOLN: INTRAMUSCULAR | Status: DC

## 2015-12-12 LAB — FRUCTOSAMINE: FRUCTOSAMINE: 484 umol/L — AB (ref 0–285)

## 2015-12-14 ENCOUNTER — Ambulatory Visit (INDEPENDENT_AMBULATORY_CARE_PROVIDER_SITE_OTHER): Payer: Medicare Other | Admitting: Endocrinology

## 2015-12-14 ENCOUNTER — Encounter: Payer: Self-pay | Admitting: Endocrinology

## 2015-12-14 VITALS — BP 122/78 | HR 118 | Temp 98.5°F | Resp 16 | Ht 71.0 in | Wt 257.4 lb

## 2015-12-14 DIAGNOSIS — Z794 Long term (current) use of insulin: Secondary | ICD-10-CM

## 2015-12-14 DIAGNOSIS — E291 Testicular hypofunction: Secondary | ICD-10-CM | POA: Diagnosis not present

## 2015-12-14 DIAGNOSIS — E1165 Type 2 diabetes mellitus with hyperglycemia: Secondary | ICD-10-CM | POA: Diagnosis not present
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
## 2015-12-14 NOTE — Patient Instructions (Signed)
Relion N insulin at [facility]  May Try Relion Reg insulin instead of Humalog  Restart Victoza  Check blood sugars on waking up 4 times a week   Also check blood sugars about 2 hours after a meal and do this after different meals by rotation   Recommended blood sugar levels on waking up is 90-130 and about 2 hours after meal is 130-160   Please bring your blood sugar monitor to each visit, thank you  Take N insulin consistently at breakfast (35 units) and bedtime (50 units)  Check coverage for Testopel for testosterone

## 2015-12-15 ENCOUNTER — Encounter (HOSPITAL_COMMUNITY): Payer: Self-pay | Admitting: *Deleted

## 2015-12-15 ENCOUNTER — Emergency Department (HOSPITAL_COMMUNITY)
Admission: EM | Admit: 2015-12-15 | Discharge: 2015-12-15 | Disposition: A | Discharge status: Home / Self Care | Payer: Medicare Other | Attending: Emergency Medicine | Admitting: Emergency Medicine

## 2015-12-15 DIAGNOSIS — E119 Type 2 diabetes mellitus without complications: Secondary | ICD-10-CM | POA: Insufficient documentation

## 2015-12-15 DIAGNOSIS — H18821 Corneal disorder due to contact lens, right eye: Secondary | ICD-10-CM | POA: Insufficient documentation

## 2015-12-15 DIAGNOSIS — F17210 Nicotine dependence, cigarettes, uncomplicated: Secondary | ICD-10-CM | POA: Diagnosis not present

## 2015-12-15 DIAGNOSIS — Z79899 Other long term (current) drug therapy: Secondary | ICD-10-CM | POA: Diagnosis not present

## 2015-12-15 DIAGNOSIS — G8929 Other chronic pain: Secondary | ICD-10-CM | POA: Insufficient documentation

## 2015-12-15 DIAGNOSIS — Z794 Long term (current) use of insulin: Secondary | ICD-10-CM | POA: Insufficient documentation

## 2015-12-15 DIAGNOSIS — I10 Essential (primary) hypertension: Secondary | ICD-10-CM | POA: Diagnosis not present

## 2015-12-15 DIAGNOSIS — Z7984 Long term (current) use of oral hypoglycemic drugs: Secondary | ICD-10-CM | POA: Insufficient documentation

## 2015-12-15 DIAGNOSIS — Z87828 Personal history of other (healed) physical injury and trauma: Secondary | ICD-10-CM | POA: Insufficient documentation

## 2015-12-15 DIAGNOSIS — H5711 Ocular pain, right eye: Secondary | ICD-10-CM | POA: Diagnosis present

## 2015-12-15 MED ORDER — TETRACAINE HCL 0.5 % OP SOLN
2.0000 [drp] | Freq: Once | OPHTHALMIC | Status: AC
  Administered 2015-12-15: 2 [drp] via OPHTHALMIC
  Filled 2015-12-15: qty 4

## 2015-12-15 MED ORDER — NAPROXEN 500 MG PO TABS: 500.0000 mg | ORAL_TABLET | Freq: Two times a day (BID) | ORAL | Status: DC

## 2015-12-15 MED ORDER — NAPROXEN 500 MG PO TABS
500.0000 mg | ORAL_TABLET | Freq: Once | ORAL | Status: AC
  Administered 2015-12-15: 500 mg via ORAL
  Filled 2015-12-15: qty 1

## 2015-12-15 MED ORDER — FLUORESCEIN SODIUM 1 MG OP STRP
1.0000 | ORAL_STRIP | Freq: Once | OPHTHALMIC | Status: AC
  Administered 2015-12-15: 1 via OPHTHALMIC
  Filled 2015-12-15: qty 1

## 2015-12-15 MED ORDER — CIPROFLOXACIN HCL 0.3 % OP SOLN: 2.0000 [drp] | OPHTHALMIC | Status: DC
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
## 2015-12-16 ENCOUNTER — Encounter: Payer: Self-pay | Admitting: Endocrinology

## 2015-12-28 ENCOUNTER — Other Ambulatory Visit: Payer: Self-pay | Admitting: *Deleted

## 2015-12-28 MED ORDER — INSULIN LISPRO 100 UNIT/ML ~~LOC~~ SOLN: 16.0000 [IU] | Freq: Three times a day (TID) | SUBCUTANEOUS | Status: DC

## 2015-12-31 LAB — HM DIABETES EYE EXAM

## 2016-01-04 ENCOUNTER — Other Ambulatory Visit: Payer: Self-pay | Admitting: *Deleted

## 2016-01-04 MED ORDER — INSULIN NPH (HUMAN) (ISOPHANE) 100 UNIT/ML ~~LOC~~ SUSP: SUBCUTANEOUS | Status: DC

## 2016-01-09 ENCOUNTER — Other Ambulatory Visit: Payer: Self-pay | Admitting: Endocrinology

## 2016-01-21 ENCOUNTER — Encounter: Payer: Self-pay | Admitting: *Deleted

## 2016-01-25 ENCOUNTER — Ambulatory Visit: Payer: Medicare Other | Admitting: Endocrinology

## 2016-02-08 ENCOUNTER — Ambulatory Visit: Payer: Medicare Other | Admitting: Family

## 2016-02-11 ENCOUNTER — Ambulatory Visit (INDEPENDENT_AMBULATORY_CARE_PROVIDER_SITE_OTHER): Payer: Medicare Other | Admitting: Family

## 2016-02-11 ENCOUNTER — Encounter: Payer: Self-pay | Admitting: Family

## 2016-02-11 VITALS — BP 160/94 | HR 93 | Temp 98.4°F | Resp 16 | Ht 71.0 in | Wt 263.0 lb

## 2016-02-11 DIAGNOSIS — G8929 Other chronic pain: Secondary | ICD-10-CM | POA: Diagnosis not present

## 2016-02-11 DIAGNOSIS — R112 Nausea with vomiting, unspecified: Secondary | ICD-10-CM | POA: Diagnosis not present

## 2016-02-11 DIAGNOSIS — G540 Brachial plexus disorders: Secondary | ICD-10-CM

## 2016-02-11 MED ORDER — ONDANSETRON HCL 4 MG PO TABS: ORAL_TABLET | ORAL | Status: DC
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
## 2016-02-11 NOTE — Assessment & Plan Note (Signed)
Nausea most likely associated with anxiety and well controlled with ondansetron. No abdominal pain. Continue current dosage of ondansetron.

## 2016-02-11 NOTE — Progress Notes (Signed)
Pre visit review using our clinic review tool, if applicable. No additional management support is needed unless otherwise documented below in the visit note. 

## 2016-02-11 NOTE — Patient Instructions (Signed)
Thank you for choosing [facility].  Summary/Instructions:  Continue to take your medications as prescribed.   Your prescription(s) have been submitted to your pharmacy or been printed and provided for you. Please take as directed and contact our office if you believe you are having problem(s) with the medication(s) or have any questions.   
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
## 2016-02-12 ENCOUNTER — Telehealth: Payer: Self-pay | Admitting: Internal Medicine

## 2016-02-12 NOTE — Telephone Encounter (Signed)
Pt called in with the name of some pain management clinic he would like to to be referred to.  He would like the referral done as soon has possible    [facility] -[phone] [facility]  - [phone] Charlotte SanesScott Bertran-  [phone] [facility] [facility] [address]   [facility] [place] -[phone] [facility]urt[facility] Junesarsha Garvin- [phone]

## 2016-02-14 ENCOUNTER — Other Ambulatory Visit: Payer: Self-pay | Admitting: Endocrinology

## 2016-02-15 NOTE — Telephone Encounter (Signed)
Please see below for patient request.

## 2016-02-18 ENCOUNTER — Telehealth: Payer: Self-pay | Admitting: Emergency Medicine

## 2016-02-18 NOTE — Telephone Encounter (Signed)
Handicap Form mailed to pt at his request.

## 2016-04-13 ENCOUNTER — Ambulatory Visit: Payer: Medicare Other | Admitting: Internal Medicine

## 2016-04-13 DIAGNOSIS — Z0289 Encounter for other administrative examinations: Secondary | ICD-10-CM

## 2016-04-19 ENCOUNTER — Other Ambulatory Visit: Payer: Self-pay | Admitting: Internal Medicine

## 2016-05-05 ENCOUNTER — Other Ambulatory Visit (INDEPENDENT_AMBULATORY_CARE_PROVIDER_SITE_OTHER): Payer: Medicare Other

## 2016-05-05 DIAGNOSIS — E291 Testicular hypofunction: Secondary | ICD-10-CM | POA: Diagnosis not present

## 2016-05-05 DIAGNOSIS — Z794 Long term (current) use of insulin: Secondary | ICD-10-CM | POA: Diagnosis not present

## 2016-05-05 DIAGNOSIS — E1165 Type 2 diabetes mellitus with hyperglycemia: Secondary | ICD-10-CM | POA: Diagnosis not present

## 2016-05-05 LAB — HEMOGLOBIN A1C: HEMOGLOBIN A1C: 12.4 % — AB (ref 4.6–6.5)

## 2016-05-05 LAB — COMPREHENSIVE METABOLIC PANEL
ALT: 23 U/L (ref 0–53)
AST: 16 U/L (ref 0–37)
Albumin: 4 g/dL (ref 3.5–5.2)
Alkaline Phosphatase: 48 U/L (ref 39–117)
BUN: 16 mg/dL (ref 6–23)
CHLORIDE: 103 meq/L (ref 96–112)
CO2: 28 meq/L (ref 19–32)
CREATININE: 0.9 mg/dL (ref 0.40–1.50)
Calcium: 9.3 mg/dL (ref 8.4–10.5)
GFR: 116.11 mL/min (ref >60.00)
GLUCOSE: 286 mg/dL — AB (ref 70–99)
POTASSIUM: 4.3 meq/L (ref 3.5–5.1)
SODIUM: 131 meq/L — AB (ref 135–145)
Total Bilirubin: 0.4 mg/dL (ref 0.2–1.2)
Total Protein: 7.6 g/dL (ref 6.0–8.3)

## 2016-05-05 LAB — TESTOSTERONE: Testosterone: 523.95 ng/dL (ref 300.00–890.00)

## 2016-05-09 ENCOUNTER — Ambulatory Visit (INDEPENDENT_AMBULATORY_CARE_PROVIDER_SITE_OTHER): Payer: Medicare Other | Admitting: Endocrinology

## 2016-05-09 ENCOUNTER — Encounter: Payer: Self-pay | Admitting: Endocrinology

## 2016-05-09 VITALS — BP 140/96 | HR 118 | Ht 71.0 in | Wt 254.0 lb

## 2016-05-09 DIAGNOSIS — Z794 Long term (current) use of insulin: Secondary | ICD-10-CM

## 2016-05-09 DIAGNOSIS — E291 Testicular hypofunction: Secondary | ICD-10-CM | POA: Diagnosis not present

## 2016-05-09 DIAGNOSIS — E1165 Type 2 diabetes mellitus with hyperglycemia: Secondary | ICD-10-CM
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
## 2016-05-12 ENCOUNTER — Other Ambulatory Visit: Payer: Self-pay | Admitting: Family

## 2016-05-24 ENCOUNTER — Emergency Department (HOSPITAL_COMMUNITY)
Admission: EM | Admit: 2016-05-24 | Discharge: 2016-05-24 | Discharge status: Against Medical Advice | Payer: Medicare Other

## 2016-05-28 ENCOUNTER — Other Ambulatory Visit: Payer: Self-pay | Admitting: Endocrinology

## 2016-06-13 ENCOUNTER — Telehealth: Payer: Self-pay | Admitting: Endocrinology

## 2016-06-13 NOTE — Telephone Encounter (Signed)
Patient need a referral to see a podiatrist, please advise

## 2016-06-13 NOTE — Telephone Encounter (Signed)
PT called back, the referral is no longer necessary. He set up his appointment.

## 2016-06-13 NOTE — Telephone Encounter (Signed)
Noted  

## 2016-06-16 ENCOUNTER — Other Ambulatory Visit (INDEPENDENT_AMBULATORY_CARE_PROVIDER_SITE_OTHER): Payer: Medicare Other

## 2016-06-16 DIAGNOSIS — E291 Testicular hypofunction: Secondary | ICD-10-CM

## 2016-06-16 DIAGNOSIS — E1165 Type 2 diabetes mellitus with hyperglycemia: Secondary | ICD-10-CM | POA: Diagnosis not present

## 2016-06-16 DIAGNOSIS — Z794 Long term (current) use of insulin: Secondary | ICD-10-CM | POA: Diagnosis not present

## 2016-06-16 LAB — BASIC METABOLIC PANEL
BUN: 13 mg/dL (ref 6–23)
CALCIUM: 9.5 mg/dL (ref 8.4–10.5)
CHLORIDE: 101 meq/L (ref 96–112)
CO2: 30 mEq/L (ref 19–32)
Creatinine, Ser: 0.78 mg/dL (ref 0.40–1.50)
GFR: 136.89 mL/min (ref >60.00)
GLUCOSE: 137 mg/dL — AB (ref 70–99)
POTASSIUM: 3.5 meq/L (ref 3.5–5.1)
SODIUM: 139 meq/L (ref 135–145)

## 2016-06-16 LAB — TESTOSTERONE: Testosterone: 388.3 ng/dL (ref 300.00–890.00)

## 2016-06-17 LAB — FRUCTOSAMINE: Fructosamine: 376 umol/L — ABNORMAL HIGH (ref 0–285)

## 2016-06-20 ENCOUNTER — Ambulatory Visit (INDEPENDENT_AMBULATORY_CARE_PROVIDER_SITE_OTHER): Payer: Medicare Other | Admitting: Endocrinology

## 2016-06-20 ENCOUNTER — Encounter: Payer: Self-pay | Admitting: Endocrinology

## 2016-06-20 VITALS — BP 129/90 | HR 108 | Ht 71.0 in | Wt 248.0 lb

## 2016-06-20 DIAGNOSIS — Z794 Long term (current) use of insulin: Secondary | ICD-10-CM

## 2016-06-20 DIAGNOSIS — E1165 Type 2 diabetes mellitus with hyperglycemia: Secondary | ICD-10-CM

## 2016-06-20 DIAGNOSIS — I10 Essential (primary) hypertension: Secondary | ICD-10-CM
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
## 2016-06-20 NOTE — Patient Instructions (Addendum)
Check blood sugars on waking up  3x per week  Also check blood sugars about 2 hours after a meal and do this after different meals by rotation  Recommended blood sugar levels on waking up is 90-130 and about 2 hours after meal is 130-160  Please bring your blood sugar monitor to each visit, thank you  Humalog reduce to 20 for low carb low fat meals

## 2016-06-21 MED ORDER — GLUCOSE BLOOD VI STRP: ORAL_STRIP | 12 refills | Status: DC

## 2016-06-21 NOTE — Addendum Note (Signed)
Addended by: Knute NeuWALKER, Julianna Vanwagner L on: 06/21/2016 02:00 PM   Modules accepted: Orders

## 2016-06-29 ENCOUNTER — Ambulatory Visit (INDEPENDENT_AMBULATORY_CARE_PROVIDER_SITE_OTHER): Payer: Medicare Other | Admitting: Family

## 2016-06-29 ENCOUNTER — Encounter: Payer: Self-pay | Admitting: Family

## 2016-06-29 VITALS — BP 158/90 | HR 112 | Temp 99.0°F | Resp 18 | Ht 71.0 in | Wt 257.0 lb

## 2016-06-29 DIAGNOSIS — G540 Brachial plexus disorders: Secondary | ICD-10-CM | POA: Diagnosis not present

## 2016-06-29 MED ORDER — PREGABALIN 75 MG PO CAPS: 75.0000 mg | ORAL_CAPSULE | Freq: Two times a day (BID) | ORAL | 1 refills | Status: DC

## 2016-06-29 NOTE — Patient Instructions (Addendum)
Thank you for choosing [facility].  SUMMARY AND INSTRUCTIONS:  Benfotiamine 300 mg daily Alpha Lipoic Acid 600 mg daily  Please continue to take your medication as prescribed.   Follow up with Dr. Lucianne MussKumar for your diabetes.   Follow up:  If your symptoms worsen or fail to improve, please contact our office for further instruction, or in case of emergency go directly to the emergency room at the closest medical facility.
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
## 2016-06-29 NOTE — Assessment & Plan Note (Signed)
Continues to experience pain associated with his brachial plexus injury. Self-discontinued methadone and now on OTC medications. Discussed medications used for neuropathic pain and patient wishes to pursue a trial of Lyrica. Also recommend trial of Benfotiamine and alpha-lipoic acid. Referral placed to neurosurgery with specialization in brachial plexus injury per patient request. Continue to monitor.

## 2016-06-30 ENCOUNTER — Telehealth: Payer: Self-pay | Admitting: Emergency Medicine

## 2016-06-30 NOTE — Telephone Encounter (Signed)
Pt called and stated he was suppose to get Cortizone shots. He needs a referral and would like to know who the doctor would be. Please advise thanks.

## 2016-06-30 NOTE — Telephone Encounter (Signed)
I would like him to see the neurosurgeon first. However if he would like I am happy to send the referral to the back non-surgical back specialists.

## 2016-07-01 NOTE — Telephone Encounter (Signed)
Pt aware.

## 2016-07-13 ENCOUNTER — Other Ambulatory Visit: Payer: Self-pay | Admitting: Endocrinology

## 2016-07-13 DIAGNOSIS — E1165 Type 2 diabetes mellitus with hyperglycemia: Secondary | ICD-10-CM

## 2016-07-13 DIAGNOSIS — Z794 Long term (current) use of insulin: Secondary | ICD-10-CM

## 2016-07-14 ENCOUNTER — Encounter: Payer: Self-pay | Admitting: Dietician

## 2016-07-14 ENCOUNTER — Encounter: Payer: Medicare Other | Attending: Endocrinology | Admitting: Dietician

## 2016-07-14 DIAGNOSIS — Z713 Dietary counseling and surveillance: Secondary | ICD-10-CM | POA: Diagnosis not present

## 2016-07-14 DIAGNOSIS — E119 Type 2 diabetes mellitus without complications: Secondary | ICD-10-CM | POA: Diagnosis not present

## 2016-07-14 DIAGNOSIS — Z7984 Long term (current) use of oral hypoglycemic drugs: Secondary | ICD-10-CM | POA: Insufficient documentation

## 2016-07-14 DIAGNOSIS — I10 Essential (primary) hypertension: Secondary | ICD-10-CM | POA: Diagnosis not present

## 2016-07-14 DIAGNOSIS — E1165 Type 2 diabetes mellitus with hyperglycemia: Secondary | ICD-10-CM

## 2016-07-14 DIAGNOSIS — IMO0002 Reserved for concepts with insufficient information to code with codable children: Secondary | ICD-10-CM

## 2016-07-14 DIAGNOSIS — E118 Type 2 diabetes mellitus with unspecified complications: Secondary | ICD-10-CM

## 2016-07-14 DIAGNOSIS — Z794 Long term (current) use of insulin: Secondary | ICD-10-CM

## 2016-07-14 NOTE — Patient Instructions (Signed)
Reducing portion sizes. Remember to take your medication. Consider meal planning. Small portion protein with each meal and snack. Stay active most days of the week.  Plan:  Aim for 4 Carb Choices per meal (60 grams) +/- 1 either way  Aim for 0-2 Carbs per snack if hungry  Consider reading food labels for Total Carbohydrate and Fat Grams of foods Consider checking BG at alternate times per day as directed by MD  Consider taking medication  as directed by MD
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
## 2016-07-27 ENCOUNTER — Encounter (HOSPITAL_COMMUNITY): Payer: Self-pay | Admitting: Emergency Medicine

## 2016-07-27 ENCOUNTER — Emergency Department (HOSPITAL_COMMUNITY)
Admission: EM | Admit: 2016-07-27 | Discharge: 2016-07-27 | Disposition: A | Discharge status: Home / Self Care | Payer: Medicare Other | Attending: Emergency Medicine | Admitting: Emergency Medicine

## 2016-07-27 DIAGNOSIS — E119 Type 2 diabetes mellitus without complications: Secondary | ICD-10-CM | POA: Insufficient documentation

## 2016-07-27 DIAGNOSIS — Z794 Long term (current) use of insulin: Secondary | ICD-10-CM | POA: Insufficient documentation

## 2016-07-27 DIAGNOSIS — Z7984 Long term (current) use of oral hypoglycemic drugs: Secondary | ICD-10-CM | POA: Insufficient documentation

## 2016-07-27 DIAGNOSIS — I10 Essential (primary) hypertension: Secondary | ICD-10-CM | POA: Insufficient documentation

## 2016-07-27 DIAGNOSIS — F17200 Nicotine dependence, unspecified, uncomplicated: Secondary | ICD-10-CM | POA: Insufficient documentation

## 2016-07-27 DIAGNOSIS — H109 Unspecified conjunctivitis: Secondary | ICD-10-CM | POA: Diagnosis not present

## 2016-07-27 DIAGNOSIS — Z8673 Personal history of transient ischemic attack (TIA), and cerebral infarction without residual deficits: Secondary | ICD-10-CM | POA: Insufficient documentation

## 2016-07-27 DIAGNOSIS — Z79899 Other long term (current) drug therapy: Secondary | ICD-10-CM | POA: Diagnosis not present

## 2016-07-27 MED ORDER — FLUORESCEIN SODIUM 1 MG OP STRP
1.0000 | ORAL_STRIP | Freq: Once | OPHTHALMIC | Status: AC
  Administered 2016-07-27: 1 via OPHTHALMIC
  Filled 2016-07-27: qty 1

## 2016-07-27 MED ORDER — ERYTHROMYCIN 5 MG/GM OP OINT: 1.0000 "application " | TOPICAL_OINTMENT | Freq: Four times a day (QID) | OPHTHALMIC | 1 refills | Status: AC

## 2016-07-27 MED ORDER — TETRACAINE HCL 0.5 % OP SOLN
2.0000 [drp] | Freq: Once | OPHTHALMIC | Status: AC
  Administered 2016-07-27: 2 [drp] via OPHTHALMIC
  Filled 2016-07-27: qty 4

## 2016-07-27 NOTE — ED Triage Notes (Signed)
Pt states that for 2 days he has had R sided eye redness with some drainage and tearing. Alert and oriented.

## 2016-07-27 NOTE — Discharge Instructions (Signed)
Apply eye drops to your left eye 4 times daily for the next 5 days. Keep your eyes clean and refrain from rubbing your right eye after touching her left eye. I recommended washing her hands after touching her eye. Follow-up with your ophthalmologist if your symptoms have not improved over the next 24-48 hours. Please return to the Emergency Department if symptoms worsen or new onset of fever, visual changes, light sensitivity, worsening drainage, swelling.

## 2016-07-27 NOTE — ED Notes (Signed)
PT DISCHARGED. INSTRUCTIONS AND PRESCRIPTION GIVEN. AAOX4. PT IN NO APPARENT DISTRESS OR PAIN. THE OPPORTUNITY TO ASK QUESTIONS WAS PROVIDED. 
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
## 2016-08-08 ENCOUNTER — Other Ambulatory Visit: Payer: Self-pay | Admitting: Endocrinology

## 2016-08-19 ENCOUNTER — Telehealth: Payer: Self-pay | Admitting: Endocrinology

## 2016-08-19 NOTE — Telephone Encounter (Signed)
Will need those labs, date of last Rx from Dr E. To discuss on next visit (has no appt yet)

## 2016-08-19 NOTE — Telephone Encounter (Signed)
Pt called and said that he would like to go back to the shots that he used to be on for Testosterone.  He said that Dr. Lennox LaityEstridge (not sure on spelling) has his recent lab results and levels if you needed to confirm with that.  Please advise and call Pt back.

## 2016-08-22 ENCOUNTER — Ambulatory Visit: Payer: Medicare Other | Admitting: Endocrinology

## 2016-08-22 ENCOUNTER — Other Ambulatory Visit: Payer: Medicare Other

## 2016-08-22 ENCOUNTER — Other Ambulatory Visit: Payer: Self-pay

## 2016-08-22 MED ORDER — TESTOSTERONE CYPIONATE 200 MG/ML IM SOLN: INTRAMUSCULAR | 3 refills | Status: DC

## 2016-08-22 NOTE — Telephone Encounter (Signed)
Patient need a refill of his medication Testosterone inj,   [facility] [phone] - [place] - [id number] [facility] [address] [phone] (Phone) [phone] (Fax)

## 2016-08-25 ENCOUNTER — Telehealth: Payer: Self-pay | Admitting: Endocrinology

## 2016-08-25 NOTE — Telephone Encounter (Signed)
Pt called again requesting the Vials that he was previously on for his testosterone.  He said that he is off the pill, and his levels are under 100.  He has been dragging all week and said that he needs to be back on the vials.  Pt is aware of the appointment tomorrow with Dr. Lucianne MussKumar, but he wanted to give him a heads up that this is also what he is needing.

## 2016-08-25 NOTE — Telephone Encounter (Signed)
Please get report of testosterone level from urologist.  Asked him when this was done

## 2016-08-25 NOTE — Telephone Encounter (Signed)
FYI

## 2016-08-26 ENCOUNTER — Encounter: Payer: Self-pay | Admitting: Endocrinology

## 2016-08-26 ENCOUNTER — Ambulatory Visit (INDEPENDENT_AMBULATORY_CARE_PROVIDER_SITE_OTHER): Payer: Medicare Other | Admitting: Endocrinology

## 2016-08-26 ENCOUNTER — Other Ambulatory Visit: Payer: Self-pay | Admitting: Endocrinology

## 2016-08-26 VITALS — BP 122/86 | Ht 71.0 in | Wt 255.0 lb

## 2016-08-26 DIAGNOSIS — Z794 Long term (current) use of insulin: Secondary | ICD-10-CM | POA: Diagnosis not present

## 2016-08-26 DIAGNOSIS — E291 Testicular hypofunction: Secondary | ICD-10-CM

## 2016-08-26 DIAGNOSIS — E1165 Type 2 diabetes mellitus with hyperglycemia: Secondary | ICD-10-CM

## 2016-08-26 LAB — MICROALBUMIN / CREATININE URINE RATIO
CREATININE, U: 197 mg/dL
MICROALB/CREAT RATIO: 1.5 mg/g (ref 0.0–30.0)
Microalb, Ur: 2.9 mg/dL — ABNORMAL HIGH (ref 0.0–1.9)

## 2016-08-26 LAB — HEMOGLOBIN A1C: Hgb A1c MFr Bld: 8.9 % — ABNORMAL HIGH (ref 4.6–6.5)

## 2016-08-26 LAB — URINALYSIS, ROUTINE W REFLEX MICROSCOPIC
BILIRUBIN URINE: NEGATIVE
HGB URINE DIPSTICK: NEGATIVE
LEUKOCYTES UA: NEGATIVE
NITRITE: NEGATIVE
PH: 5.5 (ref 5.0–8.0)
RBC / HPF: NONE SEEN (ref 0–?)
Specific Gravity, Urine: 1.025 (ref 1.000–1.030)
TOTAL PROTEIN, URINE-UPE24: NEGATIVE
Urobilinogen, UA: 0.2 (ref 0.0–1.0)

## 2016-08-26 LAB — TESTOSTERONE: Testosterone: 150.04 ng/dL — ABNORMAL LOW (ref 300.00–890.00)

## 2016-08-26 LAB — BASIC METABOLIC PANEL
BUN: 18 mg/dL (ref 6–23)
CO2: 30 meq/L (ref 19–32)
Calcium: 9.8 mg/dL (ref 8.4–10.5)
Chloride: 103 mEq/L (ref 96–112)
Creatinine, Ser: 0.93 mg/dL (ref 0.40–1.50)
GFR: 111.65 mL/min (ref >60.00)
GLUCOSE: 183 mg/dL — AB (ref 70–99)
POTASSIUM: 4 meq/L (ref 3.5–5.1)
SODIUM: 140 meq/L (ref 135–145)

## 2016-08-26 MED ORDER — TESTOSTERONE CYPIONATE 200 MG/ML IM SOLN: INTRAMUSCULAR | 3 refills | Status: DC
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
## 2016-08-31 ENCOUNTER — Other Ambulatory Visit: Payer: Self-pay | Admitting: Endocrinology

## 2016-08-31 ENCOUNTER — Other Ambulatory Visit: Payer: Self-pay | Admitting: Family

## 2016-09-09 ENCOUNTER — Other Ambulatory Visit: Payer: Medicare Other

## 2016-09-09 DIAGNOSIS — E291 Testicular hypofunction: Secondary | ICD-10-CM

## 2016-09-13 ENCOUNTER — Ambulatory Visit: Payer: Medicare Other | Admitting: Endocrinology

## 2016-09-13 LAB — PROLACTIN: PROLACTIN: 10.6 ng/mL (ref 4.0–15.2)

## 2016-09-26 ENCOUNTER — Ambulatory Visit (INDEPENDENT_AMBULATORY_CARE_PROVIDER_SITE_OTHER): Payer: Medicare Other | Admitting: Endocrinology

## 2016-09-26 ENCOUNTER — Other Ambulatory Visit: Payer: Self-pay | Admitting: Endocrinology

## 2016-09-26 ENCOUNTER — Ambulatory Visit: Payer: Medicare Other | Admitting: Endocrinology

## 2016-09-26 ENCOUNTER — Encounter: Payer: Self-pay | Admitting: Endocrinology

## 2016-09-26 VITALS — BP 142/100 | HR 108 | Temp 98.6°F | Wt 263.0 lb

## 2016-09-26 DIAGNOSIS — Z794 Long term (current) use of insulin: Secondary | ICD-10-CM

## 2016-09-26 DIAGNOSIS — E1165 Type 2 diabetes mellitus with hyperglycemia: Secondary | ICD-10-CM | POA: Diagnosis not present

## 2016-09-26 DIAGNOSIS — E291 Testicular hypofunction: Secondary | ICD-10-CM | POA: Diagnosis not present

## 2016-09-26 DIAGNOSIS — I10 Essential (primary) hypertension: Secondary | ICD-10-CM | POA: Diagnosis not present

## 2016-09-26 MED ORDER — GLUCOSE BLOOD VI STRP
ORAL_STRIP | 12 refills | Status: DC
Start: 2016-09-26 — End: 2016-09-26

## 2016-09-26 NOTE — Patient Instructions (Addendum)
Set up reminder on your phone to do insulin injections  Check blood sugars on waking up  3x per week  Also check blood sugars about 2 hours after a meal and do this after different meals by rotation  Recommended blood sugar levels on waking up is 90-130 and about 2 hours after meal is 130-160  Please bring your blood sugar monitor to each visit, thank you
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
## 2016-10-21 ENCOUNTER — Other Ambulatory Visit (INDEPENDENT_AMBULATORY_CARE_PROVIDER_SITE_OTHER): Payer: Medicare Other

## 2016-10-21 DIAGNOSIS — E1165 Type 2 diabetes mellitus with hyperglycemia: Secondary | ICD-10-CM

## 2016-10-21 DIAGNOSIS — Z794 Long term (current) use of insulin: Secondary | ICD-10-CM | POA: Diagnosis not present

## 2016-10-21 DIAGNOSIS — E291 Testicular hypofunction: Secondary | ICD-10-CM

## 2016-10-21 LAB — BASIC METABOLIC PANEL
BUN: 19 mg/dL (ref 6–23)
CO2: 35 mEq/L — ABNORMAL HIGH (ref 19–32)
Calcium: 8.8 mg/dL (ref 8.4–10.5)
Chloride: 101 mEq/L (ref 96–112)
Creatinine, Ser: 0.92 mg/dL (ref 0.40–1.50)
GFR: 112.98 mL/min (ref >60.00)
Glucose, Bld: 143 mg/dL — ABNORMAL HIGH (ref 70–99)
POTASSIUM: 4.1 meq/L (ref 3.5–5.1)
SODIUM: 141 meq/L (ref 135–145)

## 2016-10-21 LAB — HEMOGLOBIN A1C: Hgb A1c MFr Bld: 9.6 % — ABNORMAL HIGH (ref 4.6–6.5)

## 2016-10-21 LAB — TESTOSTERONE: Testosterone: 867.59 ng/dL (ref 300.00–890.00)

## 2016-10-27 ENCOUNTER — Other Ambulatory Visit: Payer: Self-pay | Admitting: Endocrinology

## 2016-10-27 ENCOUNTER — Ambulatory Visit (INDEPENDENT_AMBULATORY_CARE_PROVIDER_SITE_OTHER): Payer: Medicare Other | Admitting: Endocrinology

## 2016-10-27 ENCOUNTER — Encounter: Payer: Self-pay | Admitting: Endocrinology

## 2016-10-27 VITALS — BP 138/80 | HR 106 | Ht 72.24 in | Wt 289.4 lb

## 2016-10-27 DIAGNOSIS — E1165 Type 2 diabetes mellitus with hyperglycemia: Secondary | ICD-10-CM

## 2016-10-27 DIAGNOSIS — Z794 Long term (current) use of insulin: Secondary | ICD-10-CM

## 2016-10-27 DIAGNOSIS — E291 Testicular hypofunction: Secondary | ICD-10-CM | POA: Diagnosis not present
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
## 2016-10-27 NOTE — Patient Instructions (Addendum)
N 38 units twice daily  Stay on R till off steroid  Humalog off steroid: 25 units at Bfst, 20 at lunch and 25 at supper  Restart Metformin

## 2016-10-28 ENCOUNTER — Telehealth: Payer: Self-pay | Admitting: Endocrinology

## 2016-10-28 NOTE — Telephone Encounter (Signed)
Humalog needs to be called into [facility] on [address]

## 2016-10-29 ENCOUNTER — Other Ambulatory Visit: Payer: Self-pay | Admitting: Family

## 2016-11-01 NOTE — Telephone Encounter (Signed)
This was ordered 10/28/16

## 2016-11-07 ENCOUNTER — Other Ambulatory Visit: Payer: Self-pay | Admitting: Family

## 2016-11-22 ENCOUNTER — Other Ambulatory Visit: Payer: Self-pay

## 2016-11-22 MED ORDER — GLUCOSE BLOOD VI STRP: ORAL_STRIP | 2 refills | Status: DC

## 2016-12-19 ENCOUNTER — Other Ambulatory Visit: Payer: Self-pay | Admitting: Endocrinology

## 2016-12-20 ENCOUNTER — Other Ambulatory Visit: Payer: Self-pay | Admitting: Endocrinology

## 2016-12-21 ENCOUNTER — Other Ambulatory Visit: Payer: Self-pay | Admitting: Internal Medicine

## 2016-12-21 ENCOUNTER — Other Ambulatory Visit: Payer: Self-pay | Admitting: Endocrinology

## 2016-12-22 ENCOUNTER — Other Ambulatory Visit: Payer: Medicare Other

## 2016-12-26 ENCOUNTER — Ambulatory Visit: Payer: Medicare Other | Admitting: Endocrinology

## 2016-12-29 ENCOUNTER — Other Ambulatory Visit: Payer: Self-pay | Admitting: Family

## 2017-01-03 ENCOUNTER — Other Ambulatory Visit: Payer: Self-pay | Admitting: Endocrinology

## 2017-01-03 ENCOUNTER — Other Ambulatory Visit (INDEPENDENT_AMBULATORY_CARE_PROVIDER_SITE_OTHER): Payer: Medicare Other

## 2017-01-03 DIAGNOSIS — E1165 Type 2 diabetes mellitus with hyperglycemia: Secondary | ICD-10-CM

## 2017-01-03 DIAGNOSIS — Z794 Long term (current) use of insulin: Secondary | ICD-10-CM

## 2017-01-03 DIAGNOSIS — E291 Testicular hypofunction: Secondary | ICD-10-CM

## 2017-01-03 LAB — TESTOSTERONE: Testosterone: 332.29 ng/dL (ref 300.00–890.00)

## 2017-01-05 ENCOUNTER — Ambulatory Visit (INDEPENDENT_AMBULATORY_CARE_PROVIDER_SITE_OTHER): Payer: Medicare Other | Admitting: Endocrinology

## 2017-01-05 ENCOUNTER — Encounter: Payer: Self-pay | Admitting: Endocrinology

## 2017-01-05 VITALS — BP 128/86 | HR 106 | Ht 72.0 in | Wt 273.0 lb

## 2017-01-05 DIAGNOSIS — Z794 Long term (current) use of insulin: Secondary | ICD-10-CM

## 2017-01-05 DIAGNOSIS — E1165 Type 2 diabetes mellitus with hyperglycemia: Secondary | ICD-10-CM

## 2017-01-05 LAB — FRUCTOSAMINE: FRUCTOSAMINE: 273 umol/L — AB (ref 190–270)
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
## 2017-01-07 ENCOUNTER — Other Ambulatory Visit: Payer: Self-pay | Admitting: Endocrinology

## 2017-01-28 ENCOUNTER — Other Ambulatory Visit: Payer: Self-pay | Admitting: Endocrinology

## 2017-02-20 ENCOUNTER — Encounter: Payer: Self-pay | Admitting: Family

## 2017-02-20 ENCOUNTER — Ambulatory Visit (INDEPENDENT_AMBULATORY_CARE_PROVIDER_SITE_OTHER): Payer: Medicare Other | Admitting: Family

## 2017-02-20 VITALS — BP 150/98 | HR 111 | Temp 98.9°F | Resp 16 | Ht 72.0 in | Wt 264.1 lb

## 2017-02-20 DIAGNOSIS — K219 Gastro-esophageal reflux disease without esophagitis: Secondary | ICD-10-CM

## 2017-02-20 DIAGNOSIS — S143XXD Injury of brachial plexus, subsequent encounter: Secondary | ICD-10-CM

## 2017-02-20 MED ORDER — RANITIDINE HCL 300 MG PO TABS: 300.0000 mg | ORAL_TABLET | Freq: Every day | ORAL | 1 refills | Status: DC

## 2017-02-20 MED ORDER — OXYCODONE HCL 5 MG PO TABS: 5.0000 mg | ORAL_TABLET | Freq: Three times a day (TID) | ORAL | 0 refills | Status: DC | PRN

## 2017-02-20 NOTE — Assessment & Plan Note (Addendum)
Status post DREZ procedure and continues to experience pain located in his left upper extremity. Refer to anesthesiology per patient request. Discussed current pain management. Will provide 1 time prescription for oxycodone as he awaits anesthesiology referral. [facility] controlled substance database reviewed with no irregularities. Continue to monitor.

## 2017-02-20 NOTE — Patient Instructions (Addendum)
Thank you for choosing [facility].  SUMMARY AND INSTRUCTIONS:  Stop taking omeprazole and start taking Zantac.  May use Gas-X or Gaviscon for gas.   The referral for anesthesia has been placed.    Medication:  Your prescription(s) have been submitted to your pharmacy or been printed and provided for you. Please take as directed and contact our office if you believe you are having problem(s) with the medication(s) or have any questions.  Follow up:  If your symptoms worsen or fail to improve, please contact our office for further instruction, or in case of emergency go directly to the emergency room at the closest medical facility.
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
## 2017-02-20 NOTE — Assessment & Plan Note (Signed)
Symptoms remain consistent with GERD. Discontinue omeprazole and start ranitidine. Encouraged improving nutritional intake to assist with symptom management. Recommend Gas-X or Gaviscon as needed for distention. Follow up if symptoms worsen or do not improve.

## 2017-02-27 ENCOUNTER — Other Ambulatory Visit: Payer: Self-pay | Admitting: Endocrinology

## 2017-03-14 ENCOUNTER — Ambulatory Visit: Payer: Medicare Other | Admitting: Endocrinology

## 2017-03-14 DIAGNOSIS — Z0289 Encounter for other administrative examinations: Secondary | ICD-10-CM

## 2017-03-21 ENCOUNTER — Other Ambulatory Visit: Payer: Self-pay | Admitting: Family

## 2017-04-03 ENCOUNTER — Other Ambulatory Visit: Payer: Self-pay | Admitting: Endocrinology

## 2017-04-03 ENCOUNTER — Other Ambulatory Visit: Payer: Self-pay | Admitting: Family

## 2017-04-04 ENCOUNTER — Telehealth: Payer: Self-pay | Admitting: Endocrinology

## 2017-04-04 NOTE — Telephone Encounter (Signed)
Patient is going to get blood work done today at [facility]. Please make sure that the orders are ready for when he gets to their office around 3:30pm.

## 2017-04-04 NOTE — Telephone Encounter (Signed)
Orders are in

## 2017-04-05 ENCOUNTER — Other Ambulatory Visit (INDEPENDENT_AMBULATORY_CARE_PROVIDER_SITE_OTHER): Payer: Medicare Other

## 2017-04-05 DIAGNOSIS — Z794 Long term (current) use of insulin: Secondary | ICD-10-CM | POA: Diagnosis not present

## 2017-04-05 DIAGNOSIS — E1165 Type 2 diabetes mellitus with hyperglycemia: Secondary | ICD-10-CM | POA: Diagnosis not present

## 2017-04-05 LAB — COMPREHENSIVE METABOLIC PANEL
ALBUMIN: 4.3 g/dL (ref 3.5–5.2)
ALT: 30 U/L (ref 0–53)
AST: 16 U/L (ref 0–37)
Alkaline Phosphatase: 48 U/L (ref 39–117)
BUN: 14 mg/dL (ref 6–23)
CALCIUM: 9.4 mg/dL (ref 8.4–10.5)
CHLORIDE: 103 meq/L (ref 96–112)
CO2: 30 meq/L (ref 19–32)
CREATININE: 1.01 mg/dL (ref 0.40–1.50)
GFR: 101.25 mL/min (ref >60.00)
Glucose, Bld: 163 mg/dL — ABNORMAL HIGH (ref 70–99)
POTASSIUM: 4.1 meq/L (ref 3.5–5.1)
Sodium: 139 mEq/L (ref 135–145)
Total Bilirubin: 0.3 mg/dL (ref 0.2–1.2)
Total Protein: 7.5 g/dL (ref 6.0–8.3)

## 2017-04-05 LAB — HEMOGLOBIN A1C: Hgb A1c MFr Bld: 7.2 % — ABNORMAL HIGH (ref 4.6–6.5)

## 2017-04-07 ENCOUNTER — Encounter: Payer: Self-pay | Admitting: Endocrinology

## 2017-04-07 ENCOUNTER — Ambulatory Visit (INDEPENDENT_AMBULATORY_CARE_PROVIDER_SITE_OTHER): Payer: Medicare Other | Admitting: Endocrinology

## 2017-04-07 VITALS — BP 142/94 | HR 112 | Ht 72.0 in | Wt 254.4 lb

## 2017-04-07 DIAGNOSIS — E1165 Type 2 diabetes mellitus with hyperglycemia: Secondary | ICD-10-CM | POA: Diagnosis not present

## 2017-04-07 DIAGNOSIS — I10 Essential (primary) hypertension: Secondary | ICD-10-CM | POA: Diagnosis not present

## 2017-04-07 DIAGNOSIS — Z794 Long term (current) use of insulin: Secondary | ICD-10-CM

## 2017-04-07 MED ORDER — BISOPROLOL-HYDROCHLOROTHIAZIDE 5-6.25 MG PO TABS: 1.0000 | ORAL_TABLET | Freq: Every day | ORAL | 3 refills | Status: DC
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
## 2017-04-07 NOTE — Patient Instructions (Signed)
Gabapentin dose ?

## 2017-04-19 ENCOUNTER — Other Ambulatory Visit: Payer: Self-pay | Admitting: Endocrinology

## 2017-04-21 ENCOUNTER — Telehealth: Payer: Self-pay | Admitting: *Deleted

## 2017-04-21 NOTE — Telephone Encounter (Signed)
Rec'd call pt states he has an appt w/ pain clinic on 05/05/17, but is needing refill on his pain med now " Oxycodone". Wanting MD to refill until he see pain clinic..,.Raechel Chute/lmb

## 2017-04-24 MED ORDER — OXYCODONE HCL 5 MG PO TABS: 5.0000 mg | ORAL_TABLET | Freq: Three times a day (TID) | ORAL | 0 refills | Status: DC | PRN

## 2017-04-24 NOTE — Telephone Encounter (Signed)
Notified pt rx ready for pick-up.../lmb 

## 2017-04-24 NOTE — Telephone Encounter (Signed)
Medication printed for pick up.  

## 2017-04-24 NOTE — Telephone Encounter (Signed)
Pls advise on msg below.../lmb 

## 2017-05-10 ENCOUNTER — Ambulatory Visit: Payer: Medicare Other | Admitting: Endocrinology

## 2017-05-10 ENCOUNTER — Telehealth: Payer: Self-pay | Admitting: Endocrinology

## 2017-05-10 DIAGNOSIS — Z0289 Encounter for other administrative examinations: Secondary | ICD-10-CM

## 2017-05-10 NOTE — Telephone Encounter (Signed)
**  Remind patient they can make refill requests via MyChart**  Medication refill request (Name & Dosage):  metFORMIN (GLUCOPHAGE) 500 MG tablet  Preferred pharmacy (Name & Address):  [facility] [id number] - [place] - [id number] [facility] [address] [facility] [address] [phone] (Phone) [phone] (Fax)   Other comments (if applicable):

## 2017-05-11 ENCOUNTER — Other Ambulatory Visit: Payer: Self-pay

## 2017-05-11 MED ORDER — METFORMIN HCL 500 MG PO TABS: ORAL_TABLET | ORAL | 1 refills | Status: DC

## 2017-05-11 NOTE — Telephone Encounter (Signed)
This has been ordered 

## 2017-05-11 NOTE — Telephone Encounter (Signed)
Misty StanleyLisa has already ordered this medication.

## 2017-05-11 NOTE — Telephone Encounter (Signed)
Routing to you °

## 2017-05-12 ENCOUNTER — Other Ambulatory Visit: Payer: Self-pay | Admitting: Endocrinology

## 2017-05-15 ENCOUNTER — Other Ambulatory Visit: Payer: Self-pay

## 2017-05-15 MED ORDER — TESTOSTERONE CYPIONATE 200 MG/ML IM SOLN: INTRAMUSCULAR | 0 refills | Status: DC

## 2017-05-16 ENCOUNTER — Other Ambulatory Visit: Payer: Self-pay

## 2017-05-17 ENCOUNTER — Other Ambulatory Visit: Payer: Self-pay | Admitting: Family

## 2017-06-19 ENCOUNTER — Other Ambulatory Visit: Payer: Self-pay | Admitting: Endocrinology

## 2017-06-20 ENCOUNTER — Ambulatory Visit: Payer: Medicare Other | Admitting: Endocrinology

## 2017-06-28 LAB — HM DIABETES EYE EXAM

## 2017-06-29 ENCOUNTER — Telehealth: Payer: Self-pay | Admitting: Endocrinology

## 2017-06-29 ENCOUNTER — Other Ambulatory Visit: Payer: Self-pay

## 2017-06-29 MED ORDER — TESTOSTERONE CYPIONATE 200 MG/ML IM SOLN: INTRAMUSCULAR | 0 refills | Status: DC

## 2017-06-29 NOTE — Telephone Encounter (Signed)
I have faxed this refill to [facility].

## 2017-06-29 NOTE — Telephone Encounter (Signed)
MEDICATION:   testosterone cypionate (DEPOTESTOSTERONE CYPIONATE) 200 MG/ML injection   PHARMACY:  [facility] [id number] - [place], [place] - [address] [facility] [address] [facility] [address] [phone] (Fax)     IS THIS A 90 DAY SUPPLY : no  IS PATIENT OUT OF MEDICATION: yes  IF NOT; HOW MUCH IS LEFT: n/a  LAST APPOINTMENT DATE:04/07/17  NEXT APPOINTMENT DATE:n/a  OTHER COMMENTS:    **Let patient know to contact pharmacy at the end of the day to make sure medication is ready. **  ** Please notify patient to allow 48-72 hours to process**  **Encourage patient to contact the pharmacy for refills or they can request refills through [facility]**

## 2017-07-05 ENCOUNTER — Other Ambulatory Visit: Payer: Self-pay | Admitting: Family

## 2017-07-28 ENCOUNTER — Other Ambulatory Visit: Payer: Self-pay | Admitting: Endocrinology

## 2017-08-08 ENCOUNTER — Other Ambulatory Visit: Payer: Self-pay | Admitting: Endocrinology

## 2017-08-23 ENCOUNTER — Other Ambulatory Visit: Payer: Self-pay | Admitting: Endocrinology

## 2017-09-22 ENCOUNTER — Other Ambulatory Visit: Payer: Self-pay | Admitting: Endocrinology

## 2017-09-26 ENCOUNTER — Telehealth: Payer: Self-pay | Admitting: Endocrinology

## 2017-09-26 NOTE — Telephone Encounter (Signed)
Please see message from Dr. Lucianne MussKumar and schedule for follow up visit.  Thank you!

## 2017-09-26 NOTE — Telephone Encounter (Signed)
Please advise 

## 2017-09-26 NOTE — Telephone Encounter (Signed)
He has had no shows and needs to be rescheduled for follow-up visit before we can discuss medications

## 2017-09-26 NOTE — Telephone Encounter (Signed)
Sandy RN from [facility]MD   [employer] [facility])  Pt Dm RN wanted to speak with someone about no therapy staten?    RN will be faxing paper over to us today with all info on it. To be faxed back over to them when finished

## 2017-09-28 NOTE — Telephone Encounter (Signed)
LM for pt to call back to schedule °

## 2017-10-10 ENCOUNTER — Telehealth: Payer: Self-pay | Admitting: Endocrinology

## 2017-10-10 NOTE — Telephone Encounter (Signed)
Would you like me to get more information on this message?

## 2017-10-10 NOTE — Telephone Encounter (Signed)
Victorino DikeJennifer [phone] called from [facility] on behalf of [employer] regarding wanting to speak to clinical staff about pt being diabetic and about pt being put on a statin. Ref number [id number]. A call with responds can be or a fax. Fax number will be on fax. Thank you!

## 2017-10-10 NOTE — Telephone Encounter (Signed)
He has not been seen for 6 months and the need to talk to him about keeping his appointments.  He does not need a statin because his cholesterol is low

## 2017-10-11 ENCOUNTER — Encounter: Payer: Self-pay | Admitting: Endocrinology

## 2017-10-11 NOTE — Telephone Encounter (Signed)
Attempted to contact the pt without success no answer no [employer]email  Mailed letter

## 2017-10-11 NOTE — Telephone Encounter (Signed)
Appointment letter mailed to the patient requesting an appointment to be made.

## 2017-10-18 ENCOUNTER — Other Ambulatory Visit: Payer: Self-pay | Admitting: Endocrinology

## 2017-10-19 ENCOUNTER — Other Ambulatory Visit (INDEPENDENT_AMBULATORY_CARE_PROVIDER_SITE_OTHER): Payer: Medicare Other

## 2017-10-19 DIAGNOSIS — Z794 Long term (current) use of insulin: Secondary | ICD-10-CM

## 2017-10-19 DIAGNOSIS — E1165 Type 2 diabetes mellitus with hyperglycemia: Secondary | ICD-10-CM

## 2017-10-19 LAB — LIPID PANEL
Cholesterol: 152 mg/dL (ref 0–200)
HDL: 28.5 mg/dL — AB (ref >39.00)
NONHDL: 123.55
TRIGLYCERIDES: 271 mg/dL — AB (ref 0.0–149.0)
Total CHOL/HDL Ratio: 5
VLDL: 54.2 mg/dL — ABNORMAL HIGH (ref 0.0–40.0)

## 2017-10-19 LAB — BASIC METABOLIC PANEL
BUN: 19 mg/dL (ref 6–23)
CALCIUM: 8.8 mg/dL (ref 8.4–10.5)
CO2: 30 meq/L (ref 19–32)
CREATININE: 0.99 mg/dL (ref 0.40–1.50)
Chloride: 96 mEq/L (ref 96–112)
GFR: 103.38 mL/min (ref >60.00)
Glucose, Bld: 324 mg/dL — ABNORMAL HIGH (ref 70–99)
Potassium: 3.8 mEq/L (ref 3.5–5.1)
Sodium: 135 mEq/L (ref 135–145)

## 2017-10-19 LAB — LDL CHOLESTEROL, DIRECT: Direct LDL: 77 mg/dL

## 2017-10-19 NOTE — Telephone Encounter (Signed)
Patient has not had an ov since 04/07/17 letter has been sent out and he has no future appointment refill or refuse please advise

## 2017-10-20 ENCOUNTER — Ambulatory Visit (INDEPENDENT_AMBULATORY_CARE_PROVIDER_SITE_OTHER): Payer: Medicare Other | Admitting: Endocrinology

## 2017-10-20 ENCOUNTER — Other Ambulatory Visit: Payer: Self-pay | Admitting: *Deleted

## 2017-10-20 ENCOUNTER — Other Ambulatory Visit: Payer: Self-pay

## 2017-10-20 ENCOUNTER — Other Ambulatory Visit: Payer: Self-pay | Admitting: Endocrinology

## 2017-10-20 ENCOUNTER — Encounter: Payer: Self-pay | Admitting: Endocrinology

## 2017-10-20 ENCOUNTER — Other Ambulatory Visit: Payer: Self-pay | Admitting: Internal Medicine

## 2017-10-20 VITALS — BP 130/86 | HR 113 | Ht 72.0 in | Wt 246.4 lb

## 2017-10-20 DIAGNOSIS — E1165 Type 2 diabetes mellitus with hyperglycemia: Secondary | ICD-10-CM | POA: Diagnosis not present

## 2017-10-20 DIAGNOSIS — I10 Essential (primary) hypertension: Secondary | ICD-10-CM | POA: Diagnosis not present

## 2017-10-20 DIAGNOSIS — Z794 Long term (current) use of insulin: Secondary | ICD-10-CM | POA: Diagnosis not present

## 2017-10-20 DIAGNOSIS — E291 Testicular hypofunction: Secondary | ICD-10-CM

## 2017-10-20 LAB — POCT GLYCOSYLATED HEMOGLOBIN (HGB A1C): HEMOGLOBIN A1C: 9.5

## 2017-10-20 MED ORDER — VICTOZA 18 MG/3ML ~~LOC~~ SOPN: PEN_INJECTOR | SUBCUTANEOUS | 3 refills | Status: DC

## 2017-10-20 MED ORDER — TESTOSTERONE CYPIONATE 200 MG/ML IM SOLN: INTRAMUSCULAR | 1 refills | Status: DC

## 2017-10-20 MED ORDER — BISOPROLOL-HYDROCHLOROTHIAZIDE 5-6.25 MG PO TABS: 1.0000 | ORAL_TABLET | Freq: Every day | ORAL | 3 refills | Status: DC

## 2017-10-20 MED ORDER — LISINOPRIL-HYDROCHLOROTHIAZIDE 20-12.5 MG PO TABS: 1.0000 | ORAL_TABLET | Freq: Every day | ORAL | 0 refills | Status: DC

## 2017-10-20 MED ORDER — INSULIN NPH (HUMAN) (ISOPHANE) 100 UNIT/ML ~~LOC~~ SUSP: SUBCUTANEOUS | 2 refills | Status: DC

## 2017-10-20 MED ORDER — OMEPRAZOLE 20 MG PO CPDR: DELAYED_RELEASE_CAPSULE | ORAL | 0 refills | Status: DC

## 2017-10-20 NOTE — Patient Instructions (Addendum)
Check blood sugars on waking up  3 days  Also check blood sugars about 2 hours after a meal and do this after different meals by rotation  Recommended blood sugar levels on waking up is 90-130 and about 2 hours after meal is 130-160  Please bring your blood sugar monitor to each visit, thank you  Walk 15 min daily

## 2017-10-20 NOTE — Telephone Encounter (Signed)
We can make this a lab visit since this needs to be done before he can be seen; but we can refill his blood pressure medications for 30 days
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
## 2017-10-20 NOTE — Telephone Encounter (Signed)
Refuse with note to make appointment 

## 2017-10-20 NOTE — Telephone Encounter (Signed)
Patient made appointment for today refill if he comes?

## 2017-10-25 NOTE — Telephone Encounter (Signed)
Medication has been filled and he came if for a visit on 10/20/2017.

## 2017-11-16 ENCOUNTER — Other Ambulatory Visit: Payer: Self-pay | Admitting: Endocrinology

## 2017-11-16 ENCOUNTER — Other Ambulatory Visit: Payer: Self-pay | Admitting: Internal Medicine

## 2017-11-21 ENCOUNTER — Other Ambulatory Visit: Payer: Self-pay | Admitting: Endocrinology

## 2017-11-23 ENCOUNTER — Ambulatory Visit (INDEPENDENT_AMBULATORY_CARE_PROVIDER_SITE_OTHER): Payer: Medicare Other | Admitting: Nurse Practitioner

## 2017-11-23 ENCOUNTER — Encounter: Payer: Self-pay | Admitting: Nurse Practitioner

## 2017-11-23 VITALS — BP 144/88 | HR 94 | Temp 98.6°F | Resp 16 | Ht 72.0 in | Wt 255.8 lb

## 2017-11-23 DIAGNOSIS — I10 Essential (primary) hypertension: Secondary | ICD-10-CM | POA: Diagnosis not present

## 2017-11-23 DIAGNOSIS — K219 Gastro-esophageal reflux disease without esophagitis: Secondary | ICD-10-CM

## 2017-11-23 DIAGNOSIS — Z79899 Other long term (current) drug therapy: Secondary | ICD-10-CM | POA: Diagnosis not present

## 2017-11-23 MED ORDER — ONDANSETRON HCL 4 MG PO TABS: ORAL_TABLET | ORAL | 0 refills | Status: DC

## 2017-11-23 NOTE — Assessment & Plan Note (Signed)
Mild elevation today. It appears his endocrinologist is prescribing and managing his hypertension but he will let me know if that changes. He is scheduled for follow up with his endocrinologist next month
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
## 2017-11-23 NOTE — Assessment & Plan Note (Signed)
Stable, continue medications Zofran RX given for intermittent nausea, which may be related to foods eaten rather than GERD We discussed GERD lifestyle modifications and foods, return precautions -See AVS for education provided to patient He was instructed to notify me if he experiences GERD symptoms when taking his prilosec. We discussed trying a different medication or referral to GI if he does experience GERD symptoms while taking the prilosec

## 2017-11-23 NOTE — Patient Instructions (Addendum)
I have provided you with a copy of your EKG for your pain management provider. Your EKG looks normal to me, but I am not sure what exactly he is looking for in the EKG. Please let me know if you need any additional information.  Also, It does look like your endocrinologist is helping with your blood pressure, but please let me know if this changes.  Id like to see you back for an annual physical at your convenience, or at least every year for routine follow up.  It was nice to meet you. Thanks for letting me take care of you today :)   Gastroesophageal Reflux Disease, Adult Normally, food travels down the esophagus and stays in the stomach to be digested. If a person has gastroesophageal reflux disease (GERD), food and stomach acid move back up into the esophagus. When this happens, the esophagus becomes sore and swollen (inflamed). Over time, GERD can make small holes (ulcers) in the lining of the esophagus. Follow these instructions at home: Diet  Follow a diet as told by your doctor. You may need to avoid foods and drinks such as: ? Coffee and tea (with or without caffeine). ? Drinks that contain alcohol. ? Energy drinks and sports drinks. ? Carbonated drinks or sodas. ? Chocolate and cocoa. ? Peppermint and mint flavorings. ? Garlic and onions. ? Horseradish. ? Spicy and acidic foods, such as peppers, chili powder, curry powder, vinegar, hot sauces, and BBQ sauce. ? Citrus fruit juices and citrus fruits, such as oranges, lemons, and limes. ? Tomato-based foods, such as red sauce, chili, salsa, and pizza with red sauce. ? Fried and fatty foods, such as donuts, french fries, potato chips, and high-fat dressings. ? High-fat meats, such as hot dogs, rib eye steak, sausage, ham, and bacon. ? High-fat dairy items, such as whole milk, butter, and cream cheese.  Eat small meals often. Avoid eating large meals.  Avoid drinking large amounts of liquid with your meals.  Avoid eating meals  during the 2-3 hours before bedtime.  Avoid lying down right after you eat.  Do not exercise right after you eat. General instructions  Pay attention to any changes in your symptoms.  Take over-the-counter and prescription medicines only as told by your doctor. Do not take aspirin, ibuprofen, or other NSAIDs unless your doctor says it is okay.  Do not use any tobacco products, including cigarettes, chewing tobacco, and e-cigarettes. If you need help quitting, ask your doctor.  Wear loose clothes. Do not wear anything tight around your waist.  Raise (elevate) the head of your bed about 6 inches (15 cm).  Try to lower your stress. If you need help doing this, ask your doctor.  If you are overweight, lose an amount of weight that is healthy for you. Ask your doctor about a safe weight loss goal.  Keep all follow-up visits as told by your doctor. This is important. Contact a doctor if:  You have new symptoms.  You lose weight and you do not know why it is happening.  You have trouble swallowing, or it hurts to swallow.  You have wheezing or a cough that keeps happening.  Your symptoms do not get better with treatment.  You have a hoarse voice. Get help right away if:  You have pain in your arms, neck, jaw, teeth, or back.  You feel sweaty, dizzy, or light-headed.  You have chest pain or shortness of breath.  You throw up (vomit) and your throw up  looks like blood or coffee grounds.  You pass out (faint).  Your poop (stool) is bloody or black.  You cannot swallow, drink, or eat. This information is not intended to replace advice given to you by your health care provider. Make sure you discuss any questions you have with your health care provider. Document Released: 03/28/2008 Document Revised: 03/17/2016 Document Reviewed: 02/04/2015 Elsevier Interactive Patient Education  [employer].

## 2017-12-20 ENCOUNTER — Other Ambulatory Visit (INDEPENDENT_AMBULATORY_CARE_PROVIDER_SITE_OTHER): Payer: Medicare Other

## 2017-12-20 DIAGNOSIS — E291 Testicular hypofunction: Secondary | ICD-10-CM | POA: Diagnosis not present

## 2017-12-20 DIAGNOSIS — Z794 Long term (current) use of insulin: Secondary | ICD-10-CM | POA: Diagnosis not present

## 2017-12-20 DIAGNOSIS — E1165 Type 2 diabetes mellitus with hyperglycemia: Secondary | ICD-10-CM

## 2017-12-20 LAB — COMPREHENSIVE METABOLIC PANEL
ALBUMIN: 3.9 g/dL (ref 3.5–5.2)
ALK PHOS: 42 U/L (ref 39–117)
ALT: 17 U/L (ref 0–53)
AST: 15 U/L (ref 0–37)
BUN: 12 mg/dL (ref 6–23)
CALCIUM: 9.5 mg/dL (ref 8.4–10.5)
CHLORIDE: 100 meq/L (ref 96–112)
CO2: 29 mEq/L (ref 19–32)
Creatinine, Ser: 0.96 mg/dL (ref 0.40–1.50)
GFR: 107.04 mL/min (ref >60.00)
Glucose, Bld: 117 mg/dL — ABNORMAL HIGH (ref 70–99)
POTASSIUM: 4 meq/L (ref 3.5–5.1)
Sodium: 135 mEq/L (ref 135–145)
TOTAL PROTEIN: 7.2 g/dL (ref 6.0–8.3)
Total Bilirubin: 0.3 mg/dL (ref 0.2–1.2)

## 2017-12-20 LAB — MICROALBUMIN / CREATININE URINE RATIO
CREATININE, U: 89 mg/dL
MICROALB/CREAT RATIO: 0.8 mg/g (ref 0.0–30.0)

## 2017-12-20 LAB — HEMOGLOBIN A1C: HEMOGLOBIN A1C: 8.7 % — AB (ref 4.6–6.5)

## 2017-12-20 LAB — TESTOSTERONE: Testosterone: 515.98 ng/dL (ref 300.00–890.00)

## 2017-12-21 ENCOUNTER — Encounter: Payer: Self-pay | Admitting: Endocrinology

## 2017-12-21 ENCOUNTER — Ambulatory Visit (INDEPENDENT_AMBULATORY_CARE_PROVIDER_SITE_OTHER): Payer: Medicare Other | Admitting: Endocrinology

## 2017-12-21 VITALS — BP 146/80 | HR 96 | Ht 72.0 in | Wt 253.4 lb

## 2017-12-21 DIAGNOSIS — I10 Essential (primary) hypertension: Secondary | ICD-10-CM | POA: Diagnosis not present

## 2017-12-21 DIAGNOSIS — E291 Testicular hypofunction: Secondary | ICD-10-CM | POA: Diagnosis not present

## 2017-12-21 DIAGNOSIS — Z794 Long term (current) use of insulin: Secondary | ICD-10-CM

## 2017-12-21 DIAGNOSIS — E1165 Type 2 diabetes mellitus with hyperglycemia: Secondary | ICD-10-CM

## 2017-12-21 NOTE — Patient Instructions (Addendum)
Check blood sugars on waking up  3-4/7  Also check blood sugars about 2-3 hours after a meal and do this after different meals by rotation  Recommended blood sugar levels on waking up is 90-130 and about 2 hours after meal is 130-160  Please bring your blood sugar monitor to each visit, thank you  Restart Bisoprolol  Walk daily
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
## 2017-12-22 LAB — FRUCTOSAMINE: FRUCTOSAMINE: 315 umol/L — AB (ref 0–285)

## 2017-12-26 ENCOUNTER — Other Ambulatory Visit: Payer: Self-pay | Admitting: Internal Medicine

## 2018-02-21 ENCOUNTER — Telehealth: Payer: Self-pay | Admitting: Nurse Practitioner

## 2018-02-21 NOTE — Telephone Encounter (Signed)
Attempted to call patient to schedule AWV. Patient did not answer will try to call patient again at a later time. SF

## 2018-02-28 ENCOUNTER — Other Ambulatory Visit: Payer: Self-pay | Admitting: Endocrinology

## 2018-03-01 ENCOUNTER — Other Ambulatory Visit: Payer: Self-pay

## 2018-03-01 MED ORDER — OMEPRAZOLE 20 MG PO CPDR: DELAYED_RELEASE_CAPSULE | ORAL | 0 refills | Status: DC

## 2018-03-09 ENCOUNTER — Telehealth: Payer: Self-pay | Admitting: Endocrinology

## 2018-03-09 NOTE — Telephone Encounter (Signed)
Attempted to call number listed below and got no answer. Answering machine said "Please call back during normal business hours."

## 2018-03-09 NOTE — Telephone Encounter (Signed)
[facility] is needing a call in regards to patient. They are going to Fax over paperwork with details on statin   Please advise  [phone] REF  [id number]

## 2018-03-22 ENCOUNTER — Ambulatory Visit: Payer: Medicare Other | Admitting: Endocrinology

## 2018-03-22 HISTORY — PX: SPINAL CORD DECOMPRESSION: SHX97

## 2018-04-12 ENCOUNTER — Other Ambulatory Visit (INDEPENDENT_AMBULATORY_CARE_PROVIDER_SITE_OTHER): Payer: Medicare Other

## 2018-04-12 DIAGNOSIS — E1165 Type 2 diabetes mellitus with hyperglycemia: Secondary | ICD-10-CM | POA: Diagnosis not present

## 2018-04-12 DIAGNOSIS — Z794 Long term (current) use of insulin: Secondary | ICD-10-CM

## 2018-04-12 LAB — BASIC METABOLIC PANEL
BUN: 15 mg/dL (ref 6–23)
CALCIUM: 9.4 mg/dL (ref 8.4–10.5)
CO2: 31 mEq/L (ref 19–32)
Chloride: 101 mEq/L (ref 96–112)
Creatinine, Ser: 0.96 mg/dL (ref 0.40–1.50)
GFR: 106.9 mL/min (ref >60.00)
GLUCOSE: 142 mg/dL — AB (ref 70–99)
Potassium: 4.3 mEq/L (ref 3.5–5.1)
SODIUM: 136 meq/L (ref 135–145)

## 2018-04-12 LAB — HEMOGLOBIN A1C: Hgb A1c MFr Bld: 8 % — ABNORMAL HIGH (ref 4.6–6.5)

## 2018-04-19 ENCOUNTER — Ambulatory Visit (INDEPENDENT_AMBULATORY_CARE_PROVIDER_SITE_OTHER): Payer: Medicare Other | Admitting: Endocrinology

## 2018-04-19 ENCOUNTER — Encounter: Payer: Self-pay | Admitting: Endocrinology

## 2018-04-19 VITALS — BP 132/82 | HR 95 | Ht 72.0 in | Wt 253.0 lb

## 2018-04-19 DIAGNOSIS — E291 Testicular hypofunction: Secondary | ICD-10-CM

## 2018-04-19 DIAGNOSIS — I10 Essential (primary) hypertension: Secondary | ICD-10-CM | POA: Diagnosis not present

## 2018-04-19 DIAGNOSIS — Z794 Long term (current) use of insulin: Secondary | ICD-10-CM | POA: Diagnosis not present

## 2018-04-19 DIAGNOSIS — E1165 Type 2 diabetes mellitus with hyperglycemia: Secondary | ICD-10-CM | POA: Diagnosis not present

## 2018-04-19 NOTE — Patient Instructions (Addendum)
Check blood sugars on waking up  3/7  Also check blood sugars about 2 hours after a meal and do this after different meals by rotation  Recommended blood sugar levels on waking up is 90-130 and about 2 hours after meal is 130-160  Please bring your blood sugar monitor to each visit, thank you  Humalog 10 units at dinner daily and keep bedtime sugar <150  Walk daily
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
## 2018-04-22 ENCOUNTER — Other Ambulatory Visit: Payer: Self-pay | Admitting: Endocrinology

## 2018-04-23 ENCOUNTER — Telehealth: Payer: Self-pay | Admitting: Endocrinology

## 2018-04-23 ENCOUNTER — Other Ambulatory Visit: Payer: Self-pay

## 2018-04-23 MED ORDER — OMEPRAZOLE 20 MG PO CPDR: DELAYED_RELEASE_CAPSULE | ORAL | 0 refills | Status: DC

## 2018-04-23 MED ORDER — ONDANSETRON HCL 4 MG PO TABS: ORAL_TABLET | ORAL | 0 refills | Status: DC

## 2018-04-23 MED ORDER — INSULIN LISPRO 100 UNIT/ML ~~LOC~~ SOLN: SUBCUTANEOUS | 0 refills | Status: DC

## 2018-04-23 NOTE — Telephone Encounter (Signed)
Rx sent to pharmacy   

## 2018-04-23 NOTE — Telephone Encounter (Signed)
[facility] [id number] called re: Needs refills for Humalog insulin.

## 2018-04-23 NOTE — Telephone Encounter (Signed)
Please refill if appropriate

## 2018-06-13 ENCOUNTER — Other Ambulatory Visit: Payer: Self-pay | Admitting: Endocrinology

## 2018-06-13 ENCOUNTER — Other Ambulatory Visit: Payer: Self-pay | Admitting: Nurse Practitioner

## 2018-07-12 ENCOUNTER — Other Ambulatory Visit: Payer: Medicare Other

## 2018-07-12 ENCOUNTER — Encounter: Payer: Self-pay | Admitting: Endocrinology

## 2018-07-12 ENCOUNTER — Ambulatory Visit (INDEPENDENT_AMBULATORY_CARE_PROVIDER_SITE_OTHER): Payer: Medicare Other | Admitting: Endocrinology

## 2018-07-12 ENCOUNTER — Other Ambulatory Visit (INDEPENDENT_AMBULATORY_CARE_PROVIDER_SITE_OTHER): Payer: Medicare Other

## 2018-07-12 VITALS — BP 124/73 | HR 98 | Temp 98.7°F | Ht 72.0 in | Wt 252.0 lb

## 2018-07-12 DIAGNOSIS — E291 Testicular hypofunction: Secondary | ICD-10-CM

## 2018-07-12 DIAGNOSIS — Z794 Long term (current) use of insulin: Secondary | ICD-10-CM | POA: Diagnosis not present

## 2018-07-12 DIAGNOSIS — E1165 Type 2 diabetes mellitus with hyperglycemia: Secondary | ICD-10-CM | POA: Diagnosis not present

## 2018-07-12 DIAGNOSIS — I10 Essential (primary) hypertension: Secondary | ICD-10-CM | POA: Diagnosis not present

## 2018-07-12 LAB — COMPREHENSIVE METABOLIC PANEL
ALBUMIN: 4.3 g/dL (ref 3.5–5.2)
ALT: 49 U/L (ref 0–53)
AST: 23 U/L (ref 0–37)
Alkaline Phosphatase: 51 U/L (ref 39–117)
BUN: 20 mg/dL (ref 6–23)
CALCIUM: 9.7 mg/dL (ref 8.4–10.5)
CO2: 32 mEq/L (ref 19–32)
Chloride: 98 mEq/L (ref 96–112)
Creatinine, Ser: 0.98 mg/dL (ref 0.40–1.50)
GFR: 104.28 mL/min (ref >60.00)
Glucose, Bld: 191 mg/dL — ABNORMAL HIGH (ref 70–99)
Potassium: 4.2 mEq/L (ref 3.5–5.1)
Sodium: 136 mEq/L (ref 135–145)
Total Bilirubin: 0.4 mg/dL (ref 0.2–1.2)
Total Protein: 7.9 g/dL (ref 6.0–8.3)

## 2018-07-12 LAB — LIPID PANEL
CHOLESTEROL: 163 mg/dL (ref 0–200)
HDL: 36.2 mg/dL — AB (ref >39.00)
LDL CALC: 103 mg/dL — AB (ref 0–99)
NonHDL: 126.57
Total CHOL/HDL Ratio: 4
Triglycerides: 120 mg/dL (ref 0.0–149.0)
VLDL: 24 mg/dL (ref 0.0–40.0)

## 2018-07-12 LAB — MICROALBUMIN / CREATININE URINE RATIO
Creatinine,U: 192.8 mg/dL
MICROALB UR: 2.4 mg/dL — AB (ref 0.0–1.9)
Microalb Creat Ratio: 1.2 mg/g (ref 0.0–30.0)

## 2018-07-12 LAB — POCT GLYCOSYLATED HEMOGLOBIN (HGB A1C)

## 2018-07-12 LAB — TESTOSTERONE: Testosterone: 488.14 ng/dL (ref 300.00–890.00)

## 2018-07-12 LAB — HEMOGLOBIN A1C: Hgb A1c MFr Bld: 9.9 % — ABNORMAL HIGH (ref 4.6–6.5)
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
## 2018-07-12 NOTE — Patient Instructions (Addendum)
Check blood sugars on waking up     Also check blood sugars about 2 hours after a meal and do this after different meals by rotation  Recommended blood sugar levels on waking up is 90-130 and about 2 hours after meal is 130-160  Please bring your blood sugar monitor to each visit, thank you  Victoza 1.8mg  daily

## 2018-07-15 ENCOUNTER — Other Ambulatory Visit: Payer: Self-pay | Admitting: Endocrinology

## 2018-07-15 ENCOUNTER — Other Ambulatory Visit: Payer: Self-pay | Admitting: Nurse Practitioner

## 2018-09-13 ENCOUNTER — Ambulatory Visit: Payer: Medicare Other | Admitting: Endocrinology

## 2018-11-07 ENCOUNTER — Other Ambulatory Visit: Payer: Self-pay | Admitting: Endocrinology

## 2018-11-07 NOTE — Telephone Encounter (Signed)
Please refill is appropriate 

## 2018-11-07 NOTE — Telephone Encounter (Signed)
He needs to make a follow-up appointment with labs first

## 2018-11-07 NOTE — Telephone Encounter (Signed)
Tried to call pt and inform him to make f/u appt first. Left message for pt to call back.

## 2018-12-21 ENCOUNTER — Other Ambulatory Visit: Payer: Self-pay | Admitting: Nurse Practitioner

## 2018-12-24 ENCOUNTER — Other Ambulatory Visit: Payer: Self-pay | Admitting: Endocrinology

## 2018-12-25 NOTE — Telephone Encounter (Signed)
Has not been seen in 6 months. Refill or deny?

## 2018-12-25 NOTE — Telephone Encounter (Signed)
He has canceled his last 2 appointments, need to refuse until he makes an appointment

## 2018-12-26 ENCOUNTER — Other Ambulatory Visit: Payer: Self-pay | Admitting: Endocrinology

## 2019-01-01 ENCOUNTER — Other Ambulatory Visit: Payer: Self-pay | Admitting: Nurse Practitioner

## 2019-01-01 ENCOUNTER — Other Ambulatory Visit: Payer: Self-pay | Admitting: Endocrinology

## 2019-01-02 ENCOUNTER — Ambulatory Visit (INDEPENDENT_AMBULATORY_CARE_PROVIDER_SITE_OTHER): Payer: Medicare Other | Admitting: Nurse Practitioner

## 2019-01-02 ENCOUNTER — Encounter: Payer: Self-pay | Admitting: Nurse Practitioner

## 2019-01-02 ENCOUNTER — Other Ambulatory Visit (INDEPENDENT_AMBULATORY_CARE_PROVIDER_SITE_OTHER): Payer: Medicare Other

## 2019-01-02 ENCOUNTER — Other Ambulatory Visit: Payer: Self-pay

## 2019-01-02 VITALS — BP 130/80 | HR 104 | Ht 72.0 in | Wt 270.0 lb

## 2019-01-02 DIAGNOSIS — E1165 Type 2 diabetes mellitus with hyperglycemia: Secondary | ICD-10-CM

## 2019-01-02 DIAGNOSIS — R110 Nausea: Secondary | ICD-10-CM | POA: Diagnosis not present

## 2019-01-02 DIAGNOSIS — K219 Gastro-esophageal reflux disease without esophagitis: Secondary | ICD-10-CM

## 2019-01-02 DIAGNOSIS — Z794 Long term (current) use of insulin: Secondary | ICD-10-CM

## 2019-01-02 LAB — BASIC METABOLIC PANEL
BUN: 19 mg/dL (ref 6–23)
CO2: 32 mEq/L (ref 19–32)
Calcium: 9.2 mg/dL (ref 8.4–10.5)
Chloride: 98 mEq/L (ref 96–112)
Creatinine, Ser: 0.86 mg/dL (ref 0.40–1.50)
GFR: 113.86 mL/min (ref >60.00)
GLUCOSE: 251 mg/dL — AB (ref 70–99)
Potassium: 4.8 mEq/L (ref 3.5–5.1)
Sodium: 135 mEq/L (ref 135–145)

## 2019-01-02 LAB — CBC
HCT: 43.6 % (ref 39.0–52.0)
Hemoglobin: 14.3 g/dL (ref 13.0–17.0)
MCHC: 32.8 g/dL (ref 30.0–36.0)
MCV: 77.7 fl — ABNORMAL LOW (ref 78.0–100.0)
Platelets: 198 10*3/uL (ref 150.0–400.0)
RBC: 5.61 Mil/uL (ref 4.22–5.81)
RDW: 15.4 % (ref 11.5–15.5)
WBC: 5 10*3/uL (ref 4.0–10.5)

## 2019-01-02 LAB — MAGNESIUM: Magnesium: 2.1 mg/dL (ref 1.5–2.5)

## 2019-01-02 MED ORDER — ONDANSETRON HCL 4 MG PO TABS: ORAL_TABLET | ORAL | 0 refills | Status: DC

## 2019-01-02 MED ORDER — OMEPRAZOLE 20 MG PO CPDR: DELAYED_RELEASE_CAPSULE | ORAL | 2 refills | Status: DC

## 2019-01-02 NOTE — Patient Instructions (Addendum)
Head downstairs for labs.   Food Choices for Gastroesophageal Reflux Disease, Adult When you have gastroesophageal reflux disease (GERD), the foods you eat and your eating habits are very important. Choosing the right foods can help ease your discomfort. Think about working with a nutrition specialist (dietitian) to help you make good choices. What are tips for following this plan?  Meals  Choose healthy foods that are low in fat, such as fruits, vegetables, whole grains, low-fat dairy products, and lean meat, fish, and poultry.  Eat small meals often instead of 3 large meals a day. Eat your meals slowly, and in a place where you are relaxed. Avoid bending over or lying down until 2-3 hours after eating.  Avoid eating meals 2-3 hours before bed.  Avoid drinking a lot of liquid with meals.  Cook foods using methods other than frying. Bake, grill, or broil food instead.  Avoid or limit: ? Chocolate. ? Peppermint or spearmint. ? Alcohol. ? Pepper. ? Black and decaffeinated coffee. ? Black and decaffeinated tea. ? Bubbly (carbonated) soft drinks. ? Caffeinated energy drinks and soft drinks.  Limit high-fat foods such as: ? Fatty meat or fried foods. ? Whole milk, cream, butter, or ice cream. ? Nuts and nut butters. ? Pastries, donuts, and sweets made with butter or shortening.  Avoid foods that cause symptoms. These foods may be different for everyone. Common foods that cause symptoms include: ? Tomatoes. ? Oranges, lemons, and limes. ? Peppers. ? Spicy food. ? Onions and garlic. ? Vinegar. Lifestyle  Maintain a healthy weight. Ask your doctor what weight is healthy for you. If you need to lose weight, work with your doctor to do so safely.  Exercise for at least 30 minutes for 5 or more days each week, or as told by your doctor.  Wear loose-fitting clothes.  Do not smoke. If you need help quitting, ask your doctor.  Sleep with the head of your bed higher than your  feet. Use a wedge under the mattress or blocks under the bed frame to raise the head of the bed. Summary  When you have gastroesophageal reflux disease (GERD), food and lifestyle choices are very important in easing your symptoms.  Eat small meals often instead of 3 large meals a day. Eat your meals slowly, and in a place where you are relaxed.  Limit high-fat foods such as fatty meat or fried foods.  Avoid bending over or lying down until 2-3 hours after eating.  Avoid peppermint and spearmint, caffeine, alcohol, and chocolate. This information is not intended to replace advice given to you by your health care provider. Make sure you discuss any questions you have with your health care provider. Document Released: 04/10/2012 Document Revised: 11/15/2016 Document Reviewed: 11/15/2016 Elsevier Interactive Patient Education  2019 [employer].

## 2019-01-02 NOTE — Assessment & Plan Note (Signed)
Stable Continue current medications Home management, Food choices for GERD, red flags and return precautions including when to seek immediate care discussed and printed on AVS F/U for new, worsening symptoms - CBC; Future - Magnesium; Future - omeprazole (PRILOSEC) 20 MG capsule; TAKE 1 CAPSULE BY MOUTH TWICE DAILY BEFORE MEALS  Dispense: 180 capsule; Refill: 2
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
## 2019-01-03 ENCOUNTER — Other Ambulatory Visit: Payer: Self-pay

## 2019-01-03 ENCOUNTER — Telehealth: Payer: Self-pay | Admitting: Endocrinology

## 2019-01-03 ENCOUNTER — Other Ambulatory Visit: Payer: Self-pay | Admitting: Endocrinology

## 2019-01-03 LAB — FRUCTOSAMINE: Fructosamine: 402 umol/L — ABNORMAL HIGH (ref 0–285)

## 2019-01-03 MED ORDER — BISOPROLOL-HYDROCHLOROTHIAZIDE 5-6.25 MG PO TABS: 1.0000 | ORAL_TABLET | Freq: Every day | ORAL | 0 refills | Status: DC

## 2019-01-03 MED ORDER — METFORMIN HCL 500 MG PO TABS: ORAL_TABLET | ORAL | 0 refills | Status: DC

## 2019-01-03 MED ORDER — LISINOPRIL-HYDROCHLOROTHIAZIDE 20-12.5 MG PO TABS: 1.0000 | ORAL_TABLET | Freq: Every day | ORAL | 0 refills | Status: DC

## 2019-01-03 NOTE — Telephone Encounter (Signed)
Patient is calling back in regards to refill request that was denied by Dr Lucianne Muss due to patient not having an appointment at the time. He stated he has an appt now and needs these medications sent.     [id number] - [place], [place] - [address] AT [facility] [address] & [facility] [address]

## 2019-01-03 NOTE — Telephone Encounter (Signed)
Rx's that were denied have been refilled.

## 2019-01-09 ENCOUNTER — Other Ambulatory Visit: Payer: Self-pay | Admitting: Endocrinology

## 2019-01-10 ENCOUNTER — Other Ambulatory Visit: Payer: Self-pay

## 2019-01-10 ENCOUNTER — Ambulatory Visit (INDEPENDENT_AMBULATORY_CARE_PROVIDER_SITE_OTHER): Payer: Medicare Other | Admitting: Endocrinology

## 2019-01-10 VITALS — BP 132/92 | HR 109 | Ht 72.0 in | Wt 279.2 lb

## 2019-01-10 DIAGNOSIS — I10 Essential (primary) hypertension: Secondary | ICD-10-CM

## 2019-01-10 DIAGNOSIS — Z794 Long term (current) use of insulin: Secondary | ICD-10-CM | POA: Diagnosis not present

## 2019-01-10 DIAGNOSIS — E782 Mixed hyperlipidemia: Secondary | ICD-10-CM | POA: Diagnosis not present

## 2019-01-10 DIAGNOSIS — E291 Testicular hypofunction: Secondary | ICD-10-CM | POA: Diagnosis not present

## 2019-01-10 DIAGNOSIS — E1165 Type 2 diabetes mellitus with hyperglycemia: Secondary | ICD-10-CM | POA: Diagnosis not present

## 2019-01-10 LAB — POCT GLYCOSYLATED HEMOGLOBIN (HGB A1C): Hemoglobin A1C: 10.8 % — AB (ref 4.0–5.6)

## 2019-01-10 MED ORDER — TESTOSTERONE CYPIONATE 200 MG/ML IM SOLN: INTRAMUSCULAR | 1 refills | Status: DC

## 2019-01-10 MED ORDER — AMLODIPINE BESYLATE 5 MG PO TABS: ORAL_TABLET | ORAL | 0 refills | Status: DC

## 2019-01-10 MED ORDER — VICTOZA 18 MG/3ML ~~LOC~~ SOPN: PEN_INJECTOR | SUBCUTANEOUS | 1 refills | Status: DC
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
## 2019-01-28 ENCOUNTER — Other Ambulatory Visit: Payer: Self-pay | Admitting: Endocrinology

## 2019-02-26 ENCOUNTER — Other Ambulatory Visit: Payer: Self-pay | Admitting: Endocrinology

## 2019-02-27 ENCOUNTER — Other Ambulatory Visit: Payer: Self-pay | Admitting: *Deleted

## 2019-02-27 DIAGNOSIS — R110 Nausea: Secondary | ICD-10-CM

## 2019-02-27 MED ORDER — ONDANSETRON HCL 4 MG PO TABS: ORAL_TABLET | ORAL | 0 refills | Status: DC

## 2019-03-12 ENCOUNTER — Telehealth: Payer: Self-pay | Admitting: Endocrinology

## 2019-03-12 ENCOUNTER — Ambulatory Visit: Payer: Medicare Other | Admitting: Endocrinology

## 2019-03-12 NOTE — Telephone Encounter (Signed)
Per [facility] patient called to change appoint.  Prior to this call back we had LMTCB (x3) to advise of new office time change and needing to convert or move appointment.  I attempted to call him back thi sam, again got no answer so after his message to cancel his 03/12/2019 appt and resched in 2 weeks. I did so and left him a voicemail letting him know the new date and time for appointment with a message to let us know if that needed to change

## 2019-03-26 ENCOUNTER — Ambulatory Visit: Payer: Medicare Other | Admitting: Endocrinology

## 2019-03-28 ENCOUNTER — Other Ambulatory Visit: Payer: Self-pay | Admitting: Internal Medicine

## 2019-03-28 ENCOUNTER — Other Ambulatory Visit: Payer: Self-pay | Admitting: Endocrinology

## 2019-03-28 DIAGNOSIS — R110 Nausea: Secondary | ICD-10-CM

## 2019-04-05 ENCOUNTER — Other Ambulatory Visit: Payer: Self-pay | Admitting: Endocrinology

## 2019-04-08 ENCOUNTER — Other Ambulatory Visit: Payer: Self-pay | Admitting: Endocrinology

## 2019-04-12 ENCOUNTER — Telehealth: Payer: Self-pay | Admitting: Endocrinology

## 2019-04-12 NOTE — Telephone Encounter (Signed)
James Terry with [facility] ph# [phone], Ref# [id number] called re: status of forms that were faxed to office on 03/20/19. Please call [name] at the above ph# to advise

## 2019-04-14 ENCOUNTER — Emergency Department (HOSPITAL_COMMUNITY)
Admission: EM | Admit: 2019-04-14 | Discharge: 2019-04-14 | Disposition: A | Discharge status: Home / Self Care | Payer: Medicare Other | Attending: Emergency Medicine | Admitting: Emergency Medicine

## 2019-04-14 ENCOUNTER — Emergency Department (HOSPITAL_COMMUNITY): Payer: Medicare Other

## 2019-04-14 ENCOUNTER — Other Ambulatory Visit: Payer: Self-pay

## 2019-04-14 ENCOUNTER — Encounter (HOSPITAL_COMMUNITY): Payer: Self-pay | Admitting: Emergency Medicine

## 2019-04-14 DIAGNOSIS — Z794 Long term (current) use of insulin: Secondary | ICD-10-CM | POA: Diagnosis not present

## 2019-04-14 DIAGNOSIS — F17210 Nicotine dependence, cigarettes, uncomplicated: Secondary | ICD-10-CM | POA: Insufficient documentation

## 2019-04-14 DIAGNOSIS — I4891 Unspecified atrial fibrillation: Secondary | ICD-10-CM | POA: Insufficient documentation

## 2019-04-14 DIAGNOSIS — Z20828 Contact with and (suspected) exposure to other viral communicable diseases: Secondary | ICD-10-CM | POA: Diagnosis not present

## 2019-04-14 DIAGNOSIS — R079 Chest pain, unspecified: Secondary | ICD-10-CM | POA: Diagnosis present

## 2019-04-14 DIAGNOSIS — Z79899 Other long term (current) drug therapy: Secondary | ICD-10-CM | POA: Insufficient documentation

## 2019-04-14 DIAGNOSIS — E119 Type 2 diabetes mellitus without complications: Secondary | ICD-10-CM | POA: Insufficient documentation

## 2019-04-14 DIAGNOSIS — I10 Essential (primary) hypertension: Secondary | ICD-10-CM | POA: Insufficient documentation

## 2019-04-14 LAB — BASIC METABOLIC PANEL
Anion gap: 13 (ref 5–15)
BUN: 16 mg/dL (ref 6–20)
CO2: 26 mmol/L (ref 22–32)
Calcium: 9.2 mg/dL (ref 8.9–10.3)
Chloride: 97 mmol/L — ABNORMAL LOW (ref 98–111)
Creatinine, Ser: 0.93 mg/dL (ref 0.61–1.24)
GFR calc Af Amer: >60 mL/min (ref >60)
GFR calc non Af Amer: >60 mL/min (ref >60)
Glucose, Bld: 209 mg/dL — ABNORMAL HIGH (ref 70–99)
Potassium: 4.7 mmol/L (ref 3.5–5.1)
Sodium: 136 mmol/L (ref 135–145)

## 2019-04-14 LAB — CBC
HCT: 51.1 % (ref 39.0–52.0)
Hemoglobin: 15.6 g/dL (ref 13.0–17.0)
MCH: 25.7 pg — ABNORMAL LOW (ref 26.0–34.0)
MCHC: 30.5 g/dL (ref 30.0–36.0)
MCV: 84.3 fL (ref 80.0–100.0)
Platelets: 281 10*3/uL (ref 150–400)
RBC: 6.06 MIL/uL — ABNORMAL HIGH (ref 4.22–5.81)
RDW: 14.8 % (ref 11.5–15.5)
WBC: 10.7 10*3/uL — ABNORMAL HIGH (ref 4.0–10.5)
nRBC: 0 % (ref 0.0–0.2)

## 2019-04-14 LAB — TROPONIN I: Troponin I: <0.03 ng/mL (ref <0.03)

## 2019-04-14 LAB — MAGNESIUM: Magnesium: 1.7 mg/dL (ref 1.7–2.4)

## 2019-04-14 MED ORDER — MIDAZOLAM HCL 2 MG/2ML IJ SOLN
4.0000 mg | Freq: Once | INTRAMUSCULAR | Status: AC
  Administered 2019-04-14: 4 mg via INTRAVENOUS
  Filled 2019-04-14: qty 4

## 2019-04-14 MED ORDER — APIXABAN 5 MG PO TABS
5.0000 mg | ORAL_TABLET | Freq: Two times a day (BID) | ORAL | Status: DC
  Administered 2019-04-14: 5 mg via ORAL
  Filled 2019-04-14: qty 1

## 2019-04-14 MED ORDER — APIXABAN 5 MG PO TABS: 5.0000 mg | ORAL_TABLET | Freq: Two times a day (BID) | ORAL | 0 refills | Status: DC
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
## 2019-04-14 NOTE — Sedation Documentation (Signed)
1218 120J synced

## 2019-04-14 NOTE — ED Triage Notes (Signed)
Pt c/o chest tightness/heaviness when laying down today with SOB, nausea, and sweating. Pt reports that is a smoker. Reports when laying down, "food is piling up with no where to go  And making him SOB". hasnt had BM in couple days.

## 2019-04-14 NOTE — Discharge Instructions (Signed)
As discussed, it is important that you monitor your condition carefully, and do not hesitate to return here. Otherwise, please be sure to follow-up at the atrial fibrillation clinic and with your primary care physician.
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
## 2019-04-15 ENCOUNTER — Other Ambulatory Visit: Payer: Self-pay | Admitting: Internal Medicine

## 2019-04-15 ENCOUNTER — Telehealth (HOSPITAL_COMMUNITY): Payer: Self-pay | Admitting: *Deleted

## 2019-04-15 DIAGNOSIS — R110 Nausea: Secondary | ICD-10-CM

## 2019-04-15 NOTE — Telephone Encounter (Signed)
LMOM for pt to [facility]bk.  Pt dccv in ED

## 2019-04-15 NOTE — Telephone Encounter (Signed)
Called [facility] back and they stated that they would like to know if the patient was on a statin medication and they were informed that he is not.

## 2019-04-16 ENCOUNTER — Telehealth: Payer: Self-pay | Admitting: Internal Medicine

## 2019-04-16 LAB — NOVEL CORONAVIRUS, NAA (HOSP ORDER, SEND-OUT TO REF LAB; TAT 18-24 HRS): SARS-CoV-2, NAA: NOT DETECTED

## 2019-04-16 NOTE — Telephone Encounter (Signed)
Patient called to schedule an appointment because he is needing a refill on medication. He was a previous patient of James Terry and is needing to re-establish. Would you be willing to see him to establish care and refill medications?

## 2019-04-16 NOTE — Telephone Encounter (Signed)
James Terry with me to see, but make sure to let him know he will still need to follow with Dr Dwyane Dee for DM

## 2019-04-17 NOTE — Telephone Encounter (Signed)
Left message for patient to call back to schedule.  °

## 2019-04-19 ENCOUNTER — Other Ambulatory Visit: Payer: Self-pay

## 2019-04-19 ENCOUNTER — Encounter (HOSPITAL_COMMUNITY): Payer: Self-pay | Admitting: Physician Assistant

## 2019-04-19 ENCOUNTER — Ambulatory Visit (HOSPITAL_COMMUNITY)
Admission: RE | Admit: 2019-04-19 | Discharge: 2019-04-19 | Disposition: A | Discharge status: Home / Self Care | Payer: Medicare Other | Source: Ambulatory Visit | Attending: Physician Assistant | Admitting: Physician Assistant

## 2019-04-19 VITALS — BP 160/92 | HR 98 | Ht 71.5 in | Wt 285.0 lb

## 2019-04-19 DIAGNOSIS — I10 Essential (primary) hypertension: Secondary | ICD-10-CM | POA: Diagnosis not present

## 2019-04-19 DIAGNOSIS — Z6839 Body mass index (BMI) 39.0-39.9, adult: Secondary | ICD-10-CM | POA: Insufficient documentation

## 2019-04-19 DIAGNOSIS — G4733 Obstructive sleep apnea (adult) (pediatric): Secondary | ICD-10-CM | POA: Diagnosis not present

## 2019-04-19 DIAGNOSIS — E119 Type 2 diabetes mellitus without complications: Secondary | ICD-10-CM | POA: Insufficient documentation

## 2019-04-19 DIAGNOSIS — G8324 Monoplegia of upper limb affecting left nondominant side: Secondary | ICD-10-CM | POA: Diagnosis not present

## 2019-04-19 DIAGNOSIS — I69344 Monoplegia of lower limb following cerebral infarction affecting left non-dominant side: Secondary | ICD-10-CM | POA: Insufficient documentation

## 2019-04-19 DIAGNOSIS — Z7901 Long term (current) use of anticoagulants: Secondary | ICD-10-CM | POA: Insufficient documentation

## 2019-04-19 DIAGNOSIS — F17210 Nicotine dependence, cigarettes, uncomplicated: Secondary | ICD-10-CM | POA: Insufficient documentation

## 2019-04-19 DIAGNOSIS — I480 Paroxysmal atrial fibrillation: Secondary | ICD-10-CM | POA: Diagnosis not present

## 2019-04-19 DIAGNOSIS — Z794 Long term (current) use of insulin: Secondary | ICD-10-CM | POA: Diagnosis not present

## 2019-04-19 DIAGNOSIS — E669 Obesity, unspecified: Secondary | ICD-10-CM | POA: Insufficient documentation

## 2019-04-19 DIAGNOSIS — Z79899 Other long term (current) drug therapy: Secondary | ICD-10-CM | POA: Diagnosis not present

## 2019-04-19 MED ORDER — DILTIAZEM HCL 30 MG PO TABS: ORAL_TABLET | ORAL | 1 refills | Status: DC

## 2019-04-19 MED ORDER — AMLODIPINE BESYLATE 10 MG PO TABS: 10.0000 mg | ORAL_TABLET | Freq: Every day | ORAL | 3 refills | Status: DC

## 2019-04-19 NOTE — Patient Instructions (Signed)
Increase amlodipine to 10mg  a day  Cardizem 30mg  -- take 1 tablet every 4 hours AS NEEDED for AFIB heart rate >100
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
## 2019-05-06 ENCOUNTER — Telehealth: Payer: Self-pay | Admitting: Endocrinology

## 2019-05-06 ENCOUNTER — Other Ambulatory Visit (INDEPENDENT_AMBULATORY_CARE_PROVIDER_SITE_OTHER): Payer: Medicare Other

## 2019-05-06 DIAGNOSIS — E782 Mixed hyperlipidemia: Secondary | ICD-10-CM

## 2019-05-06 DIAGNOSIS — E1165 Type 2 diabetes mellitus with hyperglycemia: Secondary | ICD-10-CM

## 2019-05-06 DIAGNOSIS — E291 Testicular hypofunction: Secondary | ICD-10-CM

## 2019-05-06 DIAGNOSIS — Z794 Long term (current) use of insulin: Secondary | ICD-10-CM

## 2019-05-06 LAB — BASIC METABOLIC PANEL
BUN: 17 mg/dL (ref 6–23)
CO2: 28 mEq/L (ref 19–32)
Calcium: 9.5 mg/dL (ref 8.4–10.5)
Chloride: 98 mEq/L (ref 96–112)
Creatinine, Ser: 0.93 mg/dL (ref 0.40–1.50)
GFR: 103.88 mL/min (ref >60.00)
Glucose, Bld: 163 mg/dL — ABNORMAL HIGH (ref 70–99)
Potassium: 4 mEq/L (ref 3.5–5.1)
Sodium: 136 mEq/L (ref 135–145)

## 2019-05-06 LAB — TESTOSTERONE: Testosterone: 511.85 ng/dL (ref 300.00–890.00)

## 2019-05-06 LAB — LIPID PANEL
Cholesterol: 133 mg/dL (ref 0–200)
HDL: 32.6 mg/dL — ABNORMAL LOW (ref >39.00)
LDL Cholesterol: 78 mg/dL (ref 0–99)
NonHDL: 100.89
Total CHOL/HDL Ratio: 4
Triglycerides: 113 mg/dL (ref 0.0–149.0)
VLDL: 22.6 mg/dL (ref 0.0–40.0)

## 2019-05-06 LAB — HEMOGLOBIN A1C: Hgb A1c MFr Bld: 9.4 % — ABNORMAL HIGH (ref 4.6–6.5)

## 2019-05-06 NOTE — Telephone Encounter (Signed)
I do not see that pt is currently on a statin, please advise.

## 2019-05-06 NOTE — Telephone Encounter (Signed)
Harlene Salts with [facility] ph# [phone] called re: will Dr. Dwyane Dee allow patient to take a statin medication? If patient is already taking a statin medication-the pharmacy has not notified the above. Please call [place] at the above ph# to advise. Reference is Dr. Ronnie Derby NPI#.

## 2019-05-06 NOTE — Telephone Encounter (Signed)
LVM to schedule appt

## 2019-05-06 NOTE — Telephone Encounter (Signed)
lft vm to call back and schedule labs

## 2019-05-06 NOTE — Telephone Encounter (Signed)
We will consider a statin drug after his next lab work if indicated.  Also please schedule him for lab before his office visit on Wednesday.  He can come in today or preferably tomorrow morning fasting

## 2019-05-08 ENCOUNTER — Ambulatory Visit (INDEPENDENT_AMBULATORY_CARE_PROVIDER_SITE_OTHER): Payer: Medicare Other | Admitting: Endocrinology

## 2019-05-08 ENCOUNTER — Encounter: Payer: Self-pay | Admitting: Endocrinology

## 2019-05-08 ENCOUNTER — Other Ambulatory Visit: Payer: Self-pay

## 2019-05-08 VITALS — BP 110/70 | HR 86 | Ht 71.5 in | Wt 280.8 lb

## 2019-05-08 DIAGNOSIS — E1165 Type 2 diabetes mellitus with hyperglycemia: Secondary | ICD-10-CM | POA: Diagnosis not present

## 2019-05-08 DIAGNOSIS — Z794 Long term (current) use of insulin: Secondary | ICD-10-CM | POA: Diagnosis not present

## 2019-05-08 DIAGNOSIS — I10 Essential (primary) hypertension: Secondary | ICD-10-CM | POA: Diagnosis not present

## 2019-05-08 DIAGNOSIS — R110 Nausea: Secondary | ICD-10-CM | POA: Diagnosis not present

## 2019-05-08 NOTE — Patient Instructions (Signed)
60 N at bedtime  Check blood sugars on waking up days a week  Also check blood sugars about 2 hours after meals and do this after different meals by rotation  Recommended blood sugar levels on waking up are 90-130 and about 2 hours after meal is 130-160  Please bring your blood sugar monitor to each visit, thank you

## 2019-05-08 NOTE — Telephone Encounter (Signed)
Was not able to contact pt prior to appt
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
## 2019-05-09 MED ORDER — ONDANSETRON HCL 4 MG PO TABS: 4.0000 mg | ORAL_TABLET | Freq: Three times a day (TID) | ORAL | 0 refills | Status: DC | PRN

## 2019-05-10 ENCOUNTER — Telehealth (HOSPITAL_COMMUNITY): Payer: Self-pay | Admitting: *Deleted

## 2019-05-10 DIAGNOSIS — G4733 Obstructive sleep apnea (adult) (pediatric): Secondary | ICD-10-CM

## 2019-05-10 NOTE — Telephone Encounter (Signed)
Pt cld to check status of sleep study scheduling.  Order put in for sleep study as I did not see one already ordered just a referral to sleep studies.

## 2019-05-13 ENCOUNTER — Telehealth: Payer: Self-pay | Admitting: *Deleted

## 2019-05-13 NOTE — Telephone Encounter (Signed)
-----   Message from Iona Hansen, [place] sent at 05/10/2019  9:28 AM EDT ----- Regarding: Sleep study Please schedule sleep study for this patient.  I must not have put an order in for the study.  I have now ordered this.  Thank you  Estill Bamberg

## 2019-05-16 ENCOUNTER — Ambulatory Visit (HOSPITAL_COMMUNITY)
Admission: RE | Admit: 2019-05-16 | Discharge: 2019-05-16 | Disposition: A | Discharge status: Home / Self Care | Payer: Medicare Other | Source: Ambulatory Visit | Attending: Physician Assistant | Admitting: Physician Assistant

## 2019-05-16 ENCOUNTER — Encounter (HOSPITAL_COMMUNITY): Payer: Self-pay | Admitting: Physician Assistant

## 2019-05-16 ENCOUNTER — Other Ambulatory Visit: Payer: Self-pay

## 2019-05-16 VITALS — BP 132/84 | HR 94 | Ht 71.5 in | Wt 279.0 lb

## 2019-05-16 DIAGNOSIS — E669 Obesity, unspecified: Secondary | ICD-10-CM | POA: Insufficient documentation

## 2019-05-16 DIAGNOSIS — F17210 Nicotine dependence, cigarettes, uncomplicated: Secondary | ICD-10-CM | POA: Insufficient documentation

## 2019-05-16 DIAGNOSIS — Z794 Long term (current) use of insulin: Secondary | ICD-10-CM | POA: Insufficient documentation

## 2019-05-16 DIAGNOSIS — Z79899 Other long term (current) drug therapy: Secondary | ICD-10-CM | POA: Insufficient documentation

## 2019-05-16 DIAGNOSIS — Z833 Family history of diabetes mellitus: Secondary | ICD-10-CM | POA: Diagnosis not present

## 2019-05-16 DIAGNOSIS — Z8249 Family history of ischemic heart disease and other diseases of the circulatory system: Secondary | ICD-10-CM | POA: Diagnosis not present

## 2019-05-16 DIAGNOSIS — E119 Type 2 diabetes mellitus without complications: Secondary | ICD-10-CM | POA: Insufficient documentation

## 2019-05-16 DIAGNOSIS — I10 Essential (primary) hypertension: Secondary | ICD-10-CM | POA: Insufficient documentation

## 2019-05-16 DIAGNOSIS — Z7901 Long term (current) use of anticoagulants: Secondary | ICD-10-CM | POA: Insufficient documentation

## 2019-05-16 DIAGNOSIS — G8194 Hemiplegia, unspecified affecting left nondominant side: Secondary | ICD-10-CM | POA: Diagnosis not present

## 2019-05-16 DIAGNOSIS — I480 Paroxysmal atrial fibrillation: Secondary | ICD-10-CM | POA: Insufficient documentation

## 2019-05-16 DIAGNOSIS — G4733 Obstructive sleep apnea (adult) (pediatric): Secondary | ICD-10-CM | POA: Diagnosis not present

## 2019-05-16 DIAGNOSIS — Z6838 Body mass index (BMI) 38.0-38.9, adult: Secondary | ICD-10-CM | POA: Diagnosis not present

## 2019-05-16 LAB — CBC
HCT: 49.6 % (ref 39.0–52.0)
Hemoglobin: 15.4 g/dL (ref 13.0–17.0)
MCH: 25 pg — ABNORMAL LOW (ref 26.0–34.0)
MCHC: 31 g/dL (ref 30.0–36.0)
MCV: 80.4 fL (ref 80.0–100.0)
Platelets: 242 10*3/uL (ref 150–400)
RBC: 6.17 MIL/uL — ABNORMAL HIGH (ref 4.22–5.81)
RDW: 14.2 % (ref 11.5–15.5)
WBC: 5.8 10*3/uL (ref 4.0–10.5)
nRBC: 0 % (ref 0.0–0.2)

## 2019-05-16 MED ORDER — APIXABAN 5 MG PO TABS: 5.0000 mg | ORAL_TABLET | Freq: Two times a day (BID) | ORAL | 6 refills | Status: DC

## 2019-05-16 MED ORDER — DILTIAZEM HCL 30 MG PO TABS: ORAL_TABLET | ORAL | 2 refills | Status: DC
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
## 2019-06-03 ENCOUNTER — Other Ambulatory Visit: Payer: Self-pay | Admitting: Endocrinology

## 2019-06-04 ENCOUNTER — Other Ambulatory Visit: Payer: Self-pay | Admitting: Endocrinology

## 2019-06-21 ENCOUNTER — Encounter (HOSPITAL_COMMUNITY): Payer: Self-pay

## 2019-06-21 ENCOUNTER — Emergency Department (HOSPITAL_COMMUNITY): Payer: Medicare Other

## 2019-06-21 ENCOUNTER — Emergency Department (HOSPITAL_COMMUNITY)
Admission: EM | Admit: 2019-06-21 | Discharge: 2019-06-21 | Disposition: A | Discharge status: Home / Self Care | Payer: Medicare Other | Attending: Emergency Medicine | Admitting: Emergency Medicine

## 2019-06-21 ENCOUNTER — Other Ambulatory Visit: Payer: Self-pay

## 2019-06-21 DIAGNOSIS — F17210 Nicotine dependence, cigarettes, uncomplicated: Secondary | ICD-10-CM | POA: Diagnosis not present

## 2019-06-21 DIAGNOSIS — Z7901 Long term (current) use of anticoagulants: Secondary | ICD-10-CM | POA: Insufficient documentation

## 2019-06-21 DIAGNOSIS — I10 Essential (primary) hypertension: Secondary | ICD-10-CM | POA: Insufficient documentation

## 2019-06-21 DIAGNOSIS — I4891 Unspecified atrial fibrillation: Secondary | ICD-10-CM

## 2019-06-21 DIAGNOSIS — E119 Type 2 diabetes mellitus without complications: Secondary | ICD-10-CM | POA: Diagnosis not present

## 2019-06-21 DIAGNOSIS — R002 Palpitations: Secondary | ICD-10-CM | POA: Diagnosis present

## 2019-06-21 LAB — BASIC METABOLIC PANEL
Anion gap: 13 (ref 5–15)
BUN: 20 mg/dL (ref 6–20)
CO2: 27 mmol/L (ref 22–32)
Calcium: 9.5 mg/dL (ref 8.9–10.3)
Chloride: 98 mmol/L (ref 98–111)
Creatinine, Ser: 1.04 mg/dL (ref 0.61–1.24)
GFR calc Af Amer: >60 mL/min (ref >60)
GFR calc non Af Amer: >60 mL/min (ref >60)
Glucose, Bld: 96 mg/dL (ref 70–99)
Potassium: 4.1 mmol/L (ref 3.5–5.1)
Sodium: 138 mmol/L (ref 135–145)

## 2019-06-21 LAB — CBC
HCT: 53.3 % — ABNORMAL HIGH (ref 39.0–52.0)
Hemoglobin: 16.4 g/dL (ref 13.0–17.0)
MCH: 24.2 pg — ABNORMAL LOW (ref 26.0–34.0)
MCHC: 30.8 g/dL (ref 30.0–36.0)
MCV: 78.6 fL — ABNORMAL LOW (ref 80.0–100.0)
Platelets: 261 10*3/uL (ref 150–400)
RBC: 6.78 MIL/uL — ABNORMAL HIGH (ref 4.22–5.81)
RDW: 17.6 % — ABNORMAL HIGH (ref 11.5–15.5)
WBC: 10.7 10*3/uL — ABNORMAL HIGH (ref 4.0–10.5)
nRBC: 0 % (ref 0.0–0.2)

## 2019-06-21 MED ORDER — PROPOFOL 10 MG/ML IV BOLUS
2.0000 mg/kg | Freq: Once | INTRAVENOUS | Status: AC
  Administered 2019-06-21: 05:00:00 254 mg via INTRAVENOUS
  Filled 2019-06-21: qty 40

## 2019-06-21 MED ORDER — DILTIAZEM HCL 25 MG/5ML IV SOLN
20.0000 mg | Freq: Once | INTRAVENOUS | Status: AC
  Administered 2019-06-21: 04:00:00 20 mg via INTRAVENOUS
  Filled 2019-06-21: qty 5

## 2019-06-21 MED ORDER — SODIUM CHLORIDE 0.9% FLUSH: 3.0000 mL | Freq: Once | INTRAVENOUS | Status: DC

## 2019-06-21 MED ORDER — DILTIAZEM HCL ER BEADS 120 MG PO CP24: 120.0000 mg | ORAL_CAPSULE | Freq: Every day | ORAL | 1 refills | Status: DC

## 2019-06-21 MED ORDER — LACTATED RINGERS IV BOLUS
1000.0000 mL | Freq: Once | INTRAVENOUS | Status: AC
  Administered 2019-06-21: 04:00:00 1000 mL via INTRAVENOUS

## 2019-06-21 MED ORDER — DILTIAZEM HCL ER COATED BEADS 120 MG PO CP24
120.0000 mg | ORAL_CAPSULE | Freq: Once | ORAL | Status: AC
  Administered 2019-06-21: 06:00:00 120 mg via ORAL
  Filled 2019-06-21: qty 1
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
## 2019-06-21 NOTE — Sedation Documentation (Signed)
Shock delivered with 120 J.
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
## 2019-06-21 NOTE — ED Notes (Signed)
CO2 monitor applied, pads applied as well. Patient verbalized understanding of procedure.

## 2019-06-24 ENCOUNTER — Encounter (HOSPITAL_COMMUNITY): Payer: Self-pay | Admitting: *Deleted

## 2019-06-24 ENCOUNTER — Ambulatory Visit (HOSPITAL_COMMUNITY): Payer: Medicare Other | Admitting: Physician Assistant

## 2019-07-02 ENCOUNTER — Other Ambulatory Visit: Payer: Self-pay | Admitting: Endocrinology

## 2019-07-02 MED ORDER — INSULIN LISPRO 100 UNIT/ML ~~LOC~~ SOLN: SUBCUTANEOUS | 1 refills | Status: DC

## 2019-07-14 ENCOUNTER — Other Ambulatory Visit: Payer: Self-pay | Admitting: Endocrinology

## 2019-07-17 ENCOUNTER — Ambulatory Visit: Payer: Medicare Other | Admitting: Endocrinology

## 2019-07-21 ENCOUNTER — Other Ambulatory Visit: Payer: Self-pay | Admitting: Endocrinology

## 2019-07-21 DIAGNOSIS — R110 Nausea: Secondary | ICD-10-CM

## 2019-08-08 ENCOUNTER — Other Ambulatory Visit: Payer: Self-pay | Admitting: Endocrinology

## 2019-08-08 NOTE — Telephone Encounter (Signed)
Please refuse with note to make appointment 

## 2019-08-08 NOTE — Telephone Encounter (Signed)
Please refill if appropriate

## 2019-08-09 NOTE — Telephone Encounter (Signed)
Noted  

## 2019-09-02 ENCOUNTER — Other Ambulatory Visit: Payer: Self-pay | Admitting: Endocrinology

## 2019-09-03 ENCOUNTER — Telehealth: Payer: Self-pay

## 2019-09-03 NOTE — Telephone Encounter (Signed)
MEDICATION: testosterone cypionate (DEPOTESTOSTERONE CYPIONATE) 200 MG/ML injection   insulin NPH Human (HUMULIN N) 100 UNIT/ML injection  insulin lispro (HUMALOG) 100 UNIT/ML injection     PHARMACY:  [id number] - [place], [place] - [address] AT [address]  IS THIS A 90 DAY SUPPLY :   IS PATIENT OUT OF MEDICATION:   IF NOT; HOW MUCH IS LEFT:   LAST APPOINTMENT DATE: @11 /06/2019  NEXT APPOINTMENT DATE:@12 /22/2020  DO WE HAVE YOUR PERMISSION TO LEAVE A DETAILED MESSAGE:  OTHER COMMENTS:    **Let patient know to contact pharmacy at the end of the day to make sure medication is ready. **  ** Please notify patient to allow 48-72 hours to process**  **Encourage patient to contact the pharmacy for refills or they can request refills through [facility]**

## 2019-09-04 ENCOUNTER — Other Ambulatory Visit: Payer: Self-pay

## 2019-09-04 ENCOUNTER — Other Ambulatory Visit: Payer: Self-pay | Admitting: Endocrinology

## 2019-09-04 MED ORDER — INSULIN NPH (HUMAN) (ISOPHANE) 100 UNIT/ML ~~LOC~~ SUSP: 60.0000 [IU] | Freq: Every day | SUBCUTANEOUS | 0 refills | Status: DC

## 2019-09-04 MED ORDER — INSULIN LISPRO 100 UNIT/ML ~~LOC~~ SOLN: SUBCUTANEOUS | 1 refills | Status: DC

## 2019-09-04 MED ORDER — TESTOSTERONE CYPIONATE 200 MG/ML IM SOLN: INTRAMUSCULAR | 0 refills | Status: DC

## 2019-09-04 NOTE — Telephone Encounter (Signed)
Rx for insulin was sent.   Please refill testosterone if appropriate.

## 2019-10-07 ENCOUNTER — Other Ambulatory Visit: Payer: Self-pay | Admitting: Endocrinology

## 2019-10-15 ENCOUNTER — Other Ambulatory Visit: Payer: Self-pay | Admitting: Endocrinology

## 2019-10-15 ENCOUNTER — Other Ambulatory Visit: Payer: Self-pay

## 2019-10-15 ENCOUNTER — Other Ambulatory Visit (INDEPENDENT_AMBULATORY_CARE_PROVIDER_SITE_OTHER): Payer: Medicare Other

## 2019-10-15 ENCOUNTER — Ambulatory Visit: Payer: Medicare Other | Admitting: Endocrinology

## 2019-10-15 ENCOUNTER — Encounter: Payer: Self-pay | Admitting: Endocrinology

## 2019-10-15 DIAGNOSIS — E1165 Type 2 diabetes mellitus with hyperglycemia: Secondary | ICD-10-CM

## 2019-10-15 DIAGNOSIS — E291 Testicular hypofunction: Secondary | ICD-10-CM

## 2019-10-15 DIAGNOSIS — Z794 Long term (current) use of insulin: Secondary | ICD-10-CM | POA: Diagnosis not present

## 2019-10-15 LAB — COMPREHENSIVE METABOLIC PANEL WITH GFR
ALT: 40 U/L (ref 0–53)
AST: 19 U/L (ref 0–37)
Albumin: 4.2 g/dL (ref 3.5–5.2)
Alkaline Phosphatase: 61 U/L (ref 39–117)
BUN: 14 mg/dL (ref 6–23)
CO2: 25 meq/L (ref 19–32)
Calcium: 9.1 mg/dL (ref 8.4–10.5)
Chloride: 99 meq/L (ref 96–112)
Creatinine, Ser: 0.83 mg/dL (ref 0.40–1.50)
GFR: 118.25 mL/min (ref >60.00)
Glucose, Bld: 241 mg/dL — ABNORMAL HIGH (ref 70–99)
Potassium: 4.1 meq/L (ref 3.5–5.1)
Sodium: 132 meq/L — ABNORMAL LOW (ref 135–145)
Total Bilirubin: 0.3 mg/dL (ref 0.2–1.2)
Total Protein: 7.4 g/dL (ref 6.0–8.3)

## 2019-10-15 LAB — TESTOSTERONE: Testosterone: 96.97 ng/dL — ABNORMAL LOW (ref 300.00–890.00)

## 2019-10-15 LAB — HEMOGLOBIN A1C: Hgb A1c MFr Bld: 8.6 % — ABNORMAL HIGH (ref 4.6–6.5)

## 2019-10-17 ENCOUNTER — Other Ambulatory Visit: Payer: Self-pay

## 2019-10-17 ENCOUNTER — Ambulatory Visit: Payer: Medicare Other | Admitting: Endocrinology

## 2019-10-17 ENCOUNTER — Ambulatory Visit (INDEPENDENT_AMBULATORY_CARE_PROVIDER_SITE_OTHER): Payer: Medicare Other | Admitting: Endocrinology

## 2019-10-17 VITALS — BP 128/88 | HR 100 | Temp 98.5°F | Ht 72.0 in | Wt 282.6 lb

## 2019-10-17 DIAGNOSIS — E1165 Type 2 diabetes mellitus with hyperglycemia: Secondary | ICD-10-CM

## 2019-10-17 DIAGNOSIS — Z794 Long term (current) use of insulin: Secondary | ICD-10-CM

## 2019-10-17 DIAGNOSIS — I10 Essential (primary) hypertension: Secondary | ICD-10-CM | POA: Diagnosis not present

## 2019-10-17 MED ORDER — HUMALOG KWIKPEN 200 UNIT/ML ~~LOC~~ SOPN: 25.0000 [IU] | PEN_INJECTOR | Freq: Three times a day (TID) | SUBCUTANEOUS | 1 refills | Status: DC
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
## 2019-10-17 NOTE — Patient Instructions (Signed)
Take testosterone 2x per month  Check blood sugars on waking up 5 days a week  Also check blood sugars about 2 hours after meals and do this after different meals by rotation  Recommended blood sugar levels on waking up are 90-130 and about 2 hours after meal is 130-160  Please bring your blood sugar monitor to each visit, thank you  Humalog before meals

## 2019-10-21 ENCOUNTER — Institutional Professional Consult (permissible substitution): Payer: Medicare Other | Admitting: Pulmonary Disease

## 2019-10-23 ENCOUNTER — Telehealth: Payer: Self-pay | Admitting: Endocrinology

## 2019-10-23 NOTE — Telephone Encounter (Signed)
Patient dismissed from [facility] by Dr. Vicenta Aly Kumar,effective12/22/20. Dismissal letter sent out by 1st class mail 10/24/19. fbg

## 2019-10-29 ENCOUNTER — Other Ambulatory Visit (HOSPITAL_COMMUNITY): Payer: Self-pay | Admitting: Physician Assistant

## 2019-11-25 ENCOUNTER — Other Ambulatory Visit: Payer: Self-pay

## 2019-11-25 ENCOUNTER — Ambulatory Visit: Payer: Medicare Other | Admitting: Endocrinology

## 2019-11-25 ENCOUNTER — Other Ambulatory Visit: Payer: Self-pay | Admitting: Endocrinology

## 2019-11-25 ENCOUNTER — Other Ambulatory Visit (INDEPENDENT_AMBULATORY_CARE_PROVIDER_SITE_OTHER): Payer: Medicare Other

## 2019-11-25 DIAGNOSIS — Z794 Long term (current) use of insulin: Secondary | ICD-10-CM | POA: Diagnosis not present

## 2019-11-25 DIAGNOSIS — E1165 Type 2 diabetes mellitus with hyperglycemia: Secondary | ICD-10-CM

## 2019-11-25 DIAGNOSIS — E291 Testicular hypofunction: Secondary | ICD-10-CM

## 2019-11-25 LAB — BASIC METABOLIC PANEL
BUN: 19 mg/dL (ref 6–23)
CO2: 30 mEq/L (ref 19–32)
Calcium: 9.5 mg/dL (ref 8.4–10.5)
Chloride: 97 mEq/L (ref 96–112)
Creatinine, Ser: 1 mg/dL (ref 0.40–1.50)
GFR: 95.32 mL/min (ref >60.00)
Glucose, Bld: 128 mg/dL — ABNORMAL HIGH (ref 70–99)
Potassium: 4.1 mEq/L (ref 3.5–5.1)
Sodium: 137 mEq/L (ref 135–145)

## 2019-11-25 LAB — MICROALBUMIN / CREATININE URINE RATIO
Creatinine,U: 198 mg/dL
Microalb Creat Ratio: 0.4 mg/g (ref 0.0–30.0)
Microalb, Ur: <0.7 mg/dL (ref 0.0–1.9)

## 2019-11-25 LAB — TESTOSTERONE: Testosterone: 149.09 ng/dL — ABNORMAL LOW (ref 300.00–890.00)

## 2019-11-26 LAB — FRUCTOSAMINE: Fructosamine: 393 umol/L — ABNORMAL HIGH (ref 0–285)

## 2019-11-29 ENCOUNTER — Telehealth: Payer: Self-pay

## 2019-11-29 NOTE — Telephone Encounter (Signed)
Dr. Jonny Ruiz had agreed to take him on last summer. However, that was with the understanding that our office would not be managing his diabetes. I would check with Dr. Jonny Ruiz please.

## 2019-11-29 NOTE — Telephone Encounter (Signed)
Dr Jonny Ruiz, would you be willing to still accept this patient as a TOC from [facility]?  In a previous note on 04/16/2019, you agreed to do so as long as he continued to follow up with Dr Lucianne Muss for DM.  On a note from 10/23/2019, it looks like he has been dismissed from Dr Remus Blake office.

## 2019-11-29 NOTE — Telephone Encounter (Signed)
Patient calling and states that he needs to make a TOC appointment. Was an [employer] patient. Would you be will to accept him or should I direct him to another office? CB#: [phone]

## 2019-11-30 NOTE — Telephone Encounter (Signed)
Very sorry, I am unable to accept at this time 

## 2019-12-02 NOTE — Telephone Encounter (Signed)
LVM for patient to inform of below message.

## 2019-12-03 ENCOUNTER — Other Ambulatory Visit: Payer: Self-pay

## 2019-12-03 ENCOUNTER — Ambulatory Visit (INDEPENDENT_AMBULATORY_CARE_PROVIDER_SITE_OTHER): Payer: Medicare Other | Admitting: Endocrinology

## 2019-12-03 ENCOUNTER — Encounter: Payer: Self-pay | Admitting: Endocrinology

## 2019-12-03 DIAGNOSIS — E291 Testicular hypofunction: Secondary | ICD-10-CM

## 2019-12-03 DIAGNOSIS — E1165 Type 2 diabetes mellitus with hyperglycemia: Secondary | ICD-10-CM | POA: Diagnosis not present

## 2019-12-03 DIAGNOSIS — Z794 Long term (current) use of insulin: Secondary | ICD-10-CM | POA: Diagnosis not present

## 2019-12-03 MED ORDER — TESTOSTERONE CYPIONATE 200 MG/ML IM SOLN: INTRAMUSCULAR | 0 refills | Status: AC
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
## 2019-12-04 ENCOUNTER — Other Ambulatory Visit: Payer: Self-pay | Admitting: Endocrinology

## 2019-12-30 ENCOUNTER — Other Ambulatory Visit: Payer: Self-pay | Admitting: Endocrinology

## 2020-01-14 ENCOUNTER — Other Ambulatory Visit: Payer: Self-pay | Admitting: Endocrinology

## 2020-02-04 ENCOUNTER — Ambulatory Visit: Payer: Medicare Other | Admitting: Endocrinology

## 2020-02-06 ENCOUNTER — Other Ambulatory Visit (HOSPITAL_COMMUNITY): Payer: Self-pay | Admitting: Physician Assistant

## 2020-02-12 ENCOUNTER — Telehealth: Payer: Self-pay | Admitting: Endocrinology

## 2020-02-12 ENCOUNTER — Other Ambulatory Visit: Payer: Self-pay

## 2020-02-12 MED ORDER — HUMALOG KWIKPEN 200 UNIT/ML ~~LOC~~ SOPN: 25.0000 [IU] | PEN_INJECTOR | Freq: Three times a day (TID) | SUBCUTANEOUS | 1 refills | Status: DC

## 2020-02-12 MED ORDER — LISINOPRIL-HYDROCHLOROTHIAZIDE 20-12.5 MG PO TABS: 1.0000 | ORAL_TABLET | Freq: Every day | ORAL | 1 refills | Status: DC

## 2020-02-12 MED ORDER — INSULIN NPH (HUMAN) (ISOPHANE) 100 UNIT/ML ~~LOC~~ SUSP: SUBCUTANEOUS | 0 refills | Status: DC

## 2020-02-12 MED ORDER — METFORMIN HCL 500 MG PO TABS: ORAL_TABLET | ORAL | 1 refills | Status: DC

## 2020-02-12 MED ORDER — VICTOZA 18 MG/3ML ~~LOC~~ SOPN: PEN_INJECTOR | SUBCUTANEOUS | 1 refills | Status: DC

## 2020-02-12 NOTE — Telephone Encounter (Signed)
Medication Refill Request  Did you call your pharmacy and request this refill first? Yes - getting denials  . If patient has not contacted pharmacy first, instruct them to do so for future refills.  . Remind them that contacting the pharmacy for their refill is the quickest method to get the refill.  . Refill policy also stated that it will take anywhere between 24-72 hours to receive the refill.    Name of medication? Humulin N, humalog, victoza, metformin, lisinopril, viagra (did not see viagra on medication list)  Is this a 90 day supply? yes  Name and location of pharmacy?  [id number] [place], [place] - [address] AT [facility] OF [address] & [facility] [address] Phone:  [phone]  Fax:  [phone]

## 2020-02-12 NOTE — Telephone Encounter (Signed)
Rx sent. Dr.Kumar, pt wants Rx for Viagra. Please advise.

## 2020-02-12 NOTE — Telephone Encounter (Signed)
This has to be done by his PCP or urologist.  Also he needs to be seen for follow-up visit to be scheduled for next month with labs

## 2020-03-10 ENCOUNTER — Other Ambulatory Visit: Payer: Self-pay

## 2020-03-10 MED ORDER — BISOPROLOL-HYDROCHLOROTHIAZIDE 5-6.25 MG PO TABS: 1.0000 | ORAL_TABLET | Freq: Every day | ORAL | 0 refills | Status: DC

## 2020-03-20 ENCOUNTER — Other Ambulatory Visit (HOSPITAL_COMMUNITY): Payer: Self-pay | Admitting: Physician Assistant

## 2020-03-27 ENCOUNTER — Ambulatory Visit: Payer: Medicare Other | Admitting: Physician Assistant

## 2020-03-27 DIAGNOSIS — Z0289 Encounter for other administrative examinations: Secondary | ICD-10-CM
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
## 2020-04-13 ENCOUNTER — Telehealth: Payer: Self-pay | Admitting: Endocrinology

## 2020-04-13 NOTE — Telephone Encounter (Signed)
Patient dropped off Medical Record Release Forms (in accordion file at front desk) and requests that patient's medical history including labs, etc be sent to patient's new Doctor: Dr. Jeannett Senior A. [place]. Medical release Forms faxed to [facility]. Fax [phone].

## 2020-04-14 NOTE — Telephone Encounter (Signed)
Patent came in, completed medical records release form,left and then called the office back to give [place] receiving doctors information yesterday.  Form has now been sent to medical records. Fax was received by medical records

## 2020-04-30 ENCOUNTER — Other Ambulatory Visit: Payer: Self-pay | Admitting: Endocrinology

## 2020-05-01 ENCOUNTER — Other Ambulatory Visit: Payer: Self-pay | Admitting: Endocrinology

## 2020-05-05 NOTE — Telephone Encounter (Signed)
Patient called stating Dr Evlyn Kanner is still reviewing his referral and they have not contacted him for a new patient appointment yet. Patient states he needs refills and does not know what to do since he still has not been able to see the new endocrinologist. Patient [phone]

## 2020-05-06 NOTE — Telephone Encounter (Signed)
If he has a PCP can request refills from them otherwise give him only 30-day refills

## 2020-05-06 NOTE — Telephone Encounter (Signed)
Patient was dismissed in December of 2020. How would you like to proceed with this?

## 2020-05-06 NOTE — Telephone Encounter (Signed)
Called pt and requested a call back to discuss whether he has a PCP or not.

## 2020-05-08 ENCOUNTER — Ambulatory Visit (INDEPENDENT_AMBULATORY_CARE_PROVIDER_SITE_OTHER): Payer: Medicare Other | Admitting: Physician Assistant

## 2020-05-08 ENCOUNTER — Telehealth: Payer: Self-pay

## 2020-05-08 ENCOUNTER — Encounter: Payer: Self-pay | Admitting: Physician Assistant

## 2020-05-08 ENCOUNTER — Other Ambulatory Visit: Payer: Self-pay

## 2020-05-08 VITALS — BP 112/82 | HR 114 | Temp 97.2°F | Ht 72.0 in | Wt 252.4 lb

## 2020-05-08 DIAGNOSIS — I10 Essential (primary) hypertension: Secondary | ICD-10-CM

## 2020-05-08 DIAGNOSIS — Z794 Long term (current) use of insulin: Secondary | ICD-10-CM

## 2020-05-08 DIAGNOSIS — I152 Hypertension secondary to endocrine disorders: Secondary | ICD-10-CM

## 2020-05-08 DIAGNOSIS — E118 Type 2 diabetes mellitus with unspecified complications: Secondary | ICD-10-CM

## 2020-05-08 DIAGNOSIS — I480 Paroxysmal atrial fibrillation: Secondary | ICD-10-CM

## 2020-05-08 DIAGNOSIS — R110 Nausea: Secondary | ICD-10-CM

## 2020-05-08 DIAGNOSIS — M21372 Foot drop, left foot: Secondary | ICD-10-CM

## 2020-05-08 DIAGNOSIS — E1159 Type 2 diabetes mellitus with other circulatory complications: Secondary | ICD-10-CM | POA: Diagnosis not present

## 2020-05-08 DIAGNOSIS — K219 Gastro-esophageal reflux disease without esophagitis: Secondary | ICD-10-CM

## 2020-05-08 DIAGNOSIS — G8921 Chronic pain due to trauma: Secondary | ICD-10-CM

## 2020-05-08 LAB — CBC WITH DIFFERENTIAL/PLATELET
Basophils Absolute: 0.1 10*3/uL (ref 0.0–0.1)
Basophils Relative: 1 % (ref 0.0–3.0)
Eosinophils Absolute: 0.1 10*3/uL (ref 0.0–0.7)
Eosinophils Relative: 0.9 % (ref 0.0–5.0)
HCT: 52.1 % — ABNORMAL HIGH (ref 39.0–52.0)
Hemoglobin: 17 g/dL (ref 13.0–17.0)
Lymphocytes Relative: 37 % (ref 12.0–46.0)
Lymphs Abs: 2.8 10*3/uL (ref 0.7–4.0)
MCHC: 32.7 g/dL (ref 30.0–36.0)
MCV: 78.4 fl (ref 78.0–100.0)
Monocytes Absolute: 0.5 10*3/uL (ref 0.1–1.0)
Monocytes Relative: 6.5 % (ref 3.0–12.0)
Neutro Abs: 4.2 10*3/uL (ref 1.4–7.7)
Neutrophils Relative %: 54.6 % (ref 43.0–77.0)
Platelets: 241 10*3/uL (ref 150.0–400.0)
RBC: 6.64 Mil/uL — ABNORMAL HIGH (ref 4.22–5.81)
RDW: 16.4 % — ABNORMAL HIGH (ref 11.5–15.5)
WBC: 7.7 10*3/uL (ref 4.0–10.5)

## 2020-05-08 LAB — COMPREHENSIVE METABOLIC PANEL
ALT: 59 U/L — ABNORMAL HIGH (ref 0–53)
AST: 30 U/L (ref 0–37)
Albumin: 4.9 g/dL (ref 3.5–5.2)
Alkaline Phosphatase: 57 U/L (ref 39–117)
BUN: 16 mg/dL (ref 6–23)
CO2: 34 mEq/L — ABNORMAL HIGH (ref 19–32)
Calcium: 10.1 mg/dL (ref 8.4–10.5)
Chloride: 98 mEq/L (ref 96–112)
Creatinine, Ser: 1.05 mg/dL (ref 0.40–1.50)
GFR: 89.94 mL/min (ref >60.00)
Glucose, Bld: 137 mg/dL — ABNORMAL HIGH (ref 70–99)
Potassium: 4.7 mEq/L (ref 3.5–5.1)
Sodium: 139 mEq/L (ref 135–145)
Total Bilirubin: 0.6 mg/dL (ref 0.2–1.2)
Total Protein: 8.4 g/dL — ABNORMAL HIGH (ref 6.0–8.3)

## 2020-05-08 MED ORDER — "INSULIN SYRINGE 31G X 5/16"" 1 ML MISC": 3 refills | Status: AC

## 2020-05-08 MED ORDER — INSULIN NPH (HUMAN) (ISOPHANE) 100 UNIT/ML ~~LOC~~ SUSP: SUBCUTANEOUS | 0 refills | Status: DC

## 2020-05-08 MED ORDER — ONDANSETRON HCL 4 MG PO TABS: 4.0000 mg | ORAL_TABLET | Freq: Three times a day (TID) | ORAL | 0 refills | Status: DC | PRN

## 2020-05-08 MED ORDER — VICTOZA 18 MG/3ML ~~LOC~~ SOPN: PEN_INJECTOR | SUBCUTANEOUS | 1 refills | Status: DC

## 2020-05-08 MED ORDER — HUMALOG KWIKPEN 200 UNIT/ML ~~LOC~~ SOPN: 25.0000 [IU] | PEN_INJECTOR | Freq: Three times a day (TID) | SUBCUTANEOUS | 1 refills | Status: DC

## 2020-05-08 MED ORDER — AMLODIPINE BESYLATE 10 MG PO TABS: 10.0000 mg | ORAL_TABLET | Freq: Every day | ORAL | 1 refills | Status: DC

## 2020-05-08 MED ORDER — APIXABAN 5 MG PO TABS: 5.0000 mg | ORAL_TABLET | Freq: Two times a day (BID) | ORAL | 6 refills | Status: DC
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
## 2020-05-08 NOTE — Telephone Encounter (Signed)
Patient is asking for a referral to get another AFO brace for his left ankle/foot. Please advise

## 2020-05-08 NOTE — Patient Instructions (Signed)
It was great to see you!  I will be in touch with your lab results.  Medications refilled.  Please restart your eliquis, this is a blood thinner used to reduce your risk of having a stroke, given your history of irregular heart beat and smoking.  Let's follow-up in 3-6 months for a physical, sooner if you have concerns.  Take care,  Jarold Motto PA-C

## 2020-05-11 ENCOUNTER — Telehealth: Payer: Self-pay | Admitting: Physician Assistant

## 2020-05-11 ENCOUNTER — Other Ambulatory Visit: Payer: Self-pay | Admitting: Physician Assistant

## 2020-05-11 DIAGNOSIS — G8921 Chronic pain due to trauma: Secondary | ICD-10-CM

## 2020-05-11 DIAGNOSIS — M21372 Foot drop, left foot: Secondary | ICD-10-CM

## 2020-05-11 NOTE — Telephone Encounter (Signed)
Pt called requesting for a referral for occupational therapy for his left arm, an artificial foot orthotic, and to [facility]. Please advise.

## 2020-05-11 NOTE — Telephone Encounter (Signed)
Please see message. °

## 2020-05-11 NOTE — Telephone Encounter (Signed)
Can he go to [facility]? I guess we could do written referral if needed.  [facility] service in [place], [place] Address: [address] [place], [place], [place] [address] Phone: [phone]

## 2020-05-11 NOTE — Telephone Encounter (Signed)
LVM for patient to return call. 

## 2020-05-11 NOTE — Telephone Encounter (Signed)
Okay for all referrals.

## 2020-05-12 NOTE — Addendum Note (Signed)
Addended byGracy Racer on: 05/12/2020 09:56 AM   Modules accepted: Orders

## 2020-05-12 NOTE — Telephone Encounter (Signed)
Referral faxed to [facility] at [phone]. Patient's preference.

## 2020-05-12 NOTE — Addendum Note (Signed)
Addended byGracy Racer on: 05/12/2020 10:49 AM   Modules accepted: Orders

## 2020-05-12 NOTE — Telephone Encounter (Signed)
Referral placed to occupational therapy and [facility] pain management. AFO order placed and faxed to [facility].

## 2020-05-18 ENCOUNTER — Telehealth: Payer: Self-pay

## 2020-05-18 NOTE — Telephone Encounter (Signed)
Shameeka from [facility] is requesting more documentation in regards to patient's AFO for his left ankle/leg and why it is needed. This documentation is needed in order for her to send to pt's insurance in order for them to pay.

## 2020-05-19 NOTE — Telephone Encounter (Signed)
Unfortunately we did not discuss this during the visit, so it would be best if patient can give [place] insight as to  *how long has he used his current AFO *what is the diagnosis for this *what else he needs, if anything  [place]
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
## 2020-05-20 NOTE — Telephone Encounter (Signed)
Please update Shameeka from [facility] to see if this updated information is sufficient?

## 2020-05-21 NOTE — Telephone Encounter (Signed)
Documented information in letter form. Faxed over to [facility] at [facility].

## 2020-05-22 ENCOUNTER — Telehealth: Payer: Self-pay

## 2020-05-25 NOTE — Telephone Encounter (Signed)
error 

## 2020-06-05 ENCOUNTER — Telehealth: Payer: Self-pay

## 2020-06-05 NOTE — Telephone Encounter (Signed)
Pt is requesting a prescription for testosterone, but believes he needs labwork first. Please advise.

## 2020-06-05 NOTE — Telephone Encounter (Signed)
I do not prescribe testosterone.  I can refer to urology if he is interested in this.

## 2020-06-05 NOTE — Telephone Encounter (Signed)
Attempted to call patient. No answer. Voicemail full.

## 2020-06-05 NOTE — Telephone Encounter (Signed)
Please advise 

## 2020-06-10 ENCOUNTER — Telehealth: Payer: Self-pay | Admitting: Physician Assistant

## 2020-06-10 NOTE — Telephone Encounter (Signed)
Left message for patient to call back and schedule Medicare Annual Wellness Visit (AWV) either virtually/audio only OR in office. Whatever the patients preference is.  Last AWV 11/25/13; please schedule at anytime with [facility]Nurse Health Advisor at [facility].  This should be a 45 minute visit.

## 2020-06-13 ENCOUNTER — Emergency Department (HOSPITAL_COMMUNITY)
Admission: EM | Admit: 2020-06-13 | Discharge: 2020-06-13 | Disposition: A | Discharge status: Home / Self Care | Payer: Medicare Other

## 2020-06-13 ENCOUNTER — Other Ambulatory Visit: Payer: Self-pay

## 2020-06-13 NOTE — ED Triage Notes (Signed)
Patient states he does not want to be seen by doctor, he just wanted to know should he drink extra water and take vitamins if he was exposed to COVID. Patient also reports he has a brachial plexus injury and wanted to know if he should keep taking his Oxycodone. I advised patient to wait to see one of our ED providers, but patient states he will call PCP on call tomorrow.

## 2020-07-13 ENCOUNTER — Ambulatory Visit: Payer: Medicare Other

## 2020-07-13 NOTE — Patient Instructions (Incomplete)
[name] , Thank you for taking time to come for your Medicare Wellness Visit. I appreciate your ongoing commitment to your health goals. Please review the following plan we discussed and let me know if I can assist you in the future.   Screening recommendations/referrals: Colonoscopy: *** Recommended yearly ophthalmology/optometry visit for glaucoma screening and checkup Recommended yearly dental visit for hygiene and checkup  Vaccinations: Influenza vaccine: *** Pneumococcal vaccine: *** Tdap vaccine: *** Shingles vaccine: ***   Covid-19: ***  Advanced directives: ***  Conditions/risks identified: ***  Next appointment: Follow up in one year for your annual wellness visit ***  Preventive Care 40-64 Years, Male Preventive care refers to lifestyle choices and visits with your health care provider that can promote health and wellness. What does preventive care include?  A yearly physical exam. This is also called an annual well check.  Dental exams once or twice a year.  Routine eye exams. Ask your health care provider how often you should have your eyes checked.  Personal lifestyle choices, including:  Daily care of your teeth and gums.  Regular physical activity.  Eating a healthy diet.  Avoiding tobacco and drug use.  Limiting alcohol use.  Practicing safe sex.  Taking low-dose aspirin every day starting at age 27. What happens during an annual well check? The services and screenings done by your health care provider during your annual well check will depend on your age, overall health, lifestyle risk factors, and family history of disease. Counseling  Your health care provider may ask you questions about your:  Alcohol use.  Tobacco use.  Drug use.  Emotional well-being.  Home and relationship well-being.  Sexual activity.  Eating habits.  Work and work Astronomer. Screening  You may have the following tests or measurements:  Height, weight,  and BMI.  Blood pressure.  Lipid and cholesterol levels. These may be checked every 5 years, or more frequently if you are over 53 years old.  Skin check.  Lung cancer screening. You may have this screening every year starting at age 71 if you have a 30-pack-year history of smoking and currently smoke or have quit within the past 15 years.  Fecal occult blood test (FOBT) of the stool. You may have this test every year starting at age 109.  Flexible sigmoidoscopy or colonoscopy. You may have a sigmoidoscopy every 5 years or a colonoscopy every 10 years starting at age 81.  Prostate cancer screening. Recommendations will vary depending on your family history and other risks.  Hepatitis C blood test.  Hepatitis B blood test.  Sexually transmitted disease (STD) testing.  Diabetes screening. This is done by checking your blood sugar (glucose) after you have not eaten for a while (fasting). You may have this done every 1-3 years. Discuss your test results, treatment options, and if necessary, the need for more tests with your health care provider. Vaccines  Your health care provider may recommend certain vaccines, such as:  Influenza vaccine. This is recommended every year.  Tetanus, diphtheria, and acellular pertussis (Tdap, Td) vaccine. You may need a Td booster every 10 years.  Zoster vaccine. You may need this after age 47.  Pneumococcal 13-valent conjugate (PCV13) vaccine. You may need this if you have certain conditions and have not been vaccinated.  Pneumococcal polysaccharide (PPSV23) vaccine. You may need one or two doses if you smoke cigarettes or if you have certain conditions. Talk to your health care provider about which screenings and vaccines you need  and how often you need them. This information is not intended to replace advice given to you by your health care provider. Make sure you discuss any questions you have with your health care provider. Document Released:  11/06/2015 Document Revised: 06/29/2016 Document Reviewed: 08/11/2015 Elsevier Interactive Patient Education  2017 [place] Prevention in the Home Falls can cause injuries. They can happen to people of all ages. There are many things you can do to make your home safe and to help prevent falls. What can I do on the outside of my home?  Regularly fix the edges of walkways and driveways and fix any cracks.  Remove anything that might make you trip as you walk through a door, such as a raised step or threshold.  Trim any bushes or trees on the path to your home.  Use bright outdoor lighting.  Clear any walking paths of anything that might make someone trip, such as rocks or tools.  Regularly check to see if handrails are loose or broken. Make sure that both sides of any steps have handrails.  Any raised decks and porches should have guardrails on the edges.  Have any leaves, snow, or ice cleared regularly.  Use sand or salt on walking paths during winter.  Clean up any spills in your garage right away. This includes oil or grease spills. What can I do in the bathroom?  Use night lights.  Install grab bars by the toilet and in the tub and shower. Do not use towel bars as grab bars.  Use non-skid mats or decals in the tub or shower.  If you need to sit down in the shower, use a plastic, non-slip stool.  Keep the floor dry. Clean up any water that spills on the floor as soon as it happens.  Remove soap buildup in the tub or shower regularly.  Attach bath mats securely with double-sided non-slip rug tape.  Do not have throw rugs and other things on the floor that can make you trip. What can I do in the bedroom?  Use night lights.  Make sure that you have a light by your bed that is easy to reach.  Do not use any sheets or blankets that are too big for your bed. They should not hang down onto the floor.  Have a firm chair that has side arms. You can use this for  support while you get dressed.  Do not have throw rugs and other things on the floor that can make you trip. What can I do in the kitchen?  Clean up any spills right away.  Avoid walking on wet floors.  Keep items that you use a lot in easy-to-reach places.  If you need to reach something above you, use a strong step stool that has a grab bar.  Keep electrical cords out of the way.  Do not use floor polish or wax that makes floors slippery. If you must use wax, use non-skid floor wax.  Do not have throw rugs and other things on the floor that can make you trip. What can I do with my stairs?  Do not leave any items on the stairs.  Make sure that there are handrails on both sides of the stairs and use them. Fix handrails that are broken or loose. Make sure that handrails are as long as the stairways.  Check any carpeting to make sure that it is firmly attached to the stairs. Fix any carpet that is  loose or worn.  Avoid having throw rugs at the top or bottom of the stairs. If you do have throw rugs, attach them to the floor with carpet tape.  Make sure that you have a light switch at the top of the stairs and the bottom of the stairs. If you do not have them, ask someone to add them for you. What else can I do to help prevent falls?  Wear shoes that:  Do not have high heels.  Have rubber bottoms.  Are comfortable and fit you well.  Are closed at the toe. Do not wear sandals.  If you use a stepladder:  Make sure that it is fully opened. Do not climb a closed stepladder.  Make sure that both sides of the stepladder are locked into place.  Ask someone to hold it for you, if possible.  Clearly mark and make sure that you can see:  Any grab bars or handrails.  First and last steps.  Where the edge of each step is.  Use tools that help you move around (mobility aids) if they are needed. These include:  Canes.  Walkers.  Scooters.  Crutches.  Turn on the  lights when you go into a dark area. Replace any light bulbs as soon as they burn out.  Set up your furniture so you have a clear path. Avoid moving your furniture around.  If any of your floors are uneven, fix them.  If there are any pets around you, be aware of where they are.  Review your medicines with your doctor. Some medicines can make you feel dizzy. This can increase your chance of falling. Ask your doctor what other things that you can do to help prevent falls. This information is not intended to replace advice given to you by your health care provider. Make sure you discuss any questions you have with your health care provider. Document Released: 08/06/2009 Document Revised: 03/17/2016 Document Reviewed: 11/14/2014 Elsevier Interactive Patient Education  2017 [employer].

## 2020-07-14 ENCOUNTER — Ambulatory Visit: Payer: Medicare Other | Admitting: Physician Assistant

## 2020-07-14 ENCOUNTER — Other Ambulatory Visit: Payer: Self-pay | Admitting: Physician Assistant

## 2020-07-14 ENCOUNTER — Other Ambulatory Visit: Payer: Self-pay

## 2020-07-14 DIAGNOSIS — R7989 Other specified abnormal findings of blood chemistry: Secondary | ICD-10-CM

## 2020-07-14 DIAGNOSIS — E291 Testicular hypofunction: Secondary | ICD-10-CM
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
## 2020-07-15 ENCOUNTER — Other Ambulatory Visit: Payer: Medicare Other

## 2020-07-15 DIAGNOSIS — R7989 Other specified abnormal findings of blood chemistry: Secondary | ICD-10-CM

## 2020-07-15 DIAGNOSIS — E291 Testicular hypofunction: Secondary | ICD-10-CM

## 2020-07-16 LAB — HEPATIC FUNCTION PANEL
AG Ratio: 1.5 (calc) (ref 1.0–2.5)
ALT: 27 U/L (ref 9–46)
AST: 16 U/L (ref 10–35)
Albumin: 4.2 g/dL (ref 3.6–5.1)
Alkaline phosphatase (APISO): 51 U/L (ref 35–144)
Bilirubin, Direct: 0.1 mg/dL (ref 0.0–0.2)
Globulin: 2.8 g/dL (calc) (ref 1.9–3.7)
Indirect Bilirubin: 0.2 mg/dL (calc) (ref 0.2–1.2)
Total Bilirubin: 0.3 mg/dL (ref 0.2–1.2)
Total Protein: 7 g/dL (ref 6.1–8.1)

## 2020-07-16 LAB — TESTOSTERONE: Testosterone: 35 ng/dL — ABNORMAL LOW (ref 250–827)

## 2020-07-24 ENCOUNTER — Encounter: Payer: Self-pay | Admitting: *Deleted

## 2020-08-10 ENCOUNTER — Encounter: Payer: Self-pay | Admitting: Physician Assistant

## 2020-08-10 ENCOUNTER — Other Ambulatory Visit: Payer: Self-pay

## 2020-08-10 ENCOUNTER — Ambulatory Visit (INDEPENDENT_AMBULATORY_CARE_PROVIDER_SITE_OTHER): Payer: Medicare Other | Admitting: Physician Assistant

## 2020-08-10 VITALS — BP 130/84 | HR 96 | Temp 97.1°F | Ht 72.0 in | Wt 256.4 lb

## 2020-08-10 DIAGNOSIS — Z23 Encounter for immunization: Secondary | ICD-10-CM

## 2020-08-10 DIAGNOSIS — I10 Essential (primary) hypertension: Secondary | ICD-10-CM

## 2020-08-10 DIAGNOSIS — R110 Nausea: Secondary | ICD-10-CM

## 2020-08-10 MED ORDER — ONDANSETRON HCL 4 MG PO TABS: 4.0000 mg | ORAL_TABLET | Freq: Three times a day (TID) | ORAL | 1 refills | Status: DC | PRN

## 2020-08-10 MED ORDER — LISINOPRIL-HYDROCHLOROTHIAZIDE 20-12.5 MG PO TABS: 1.0000 | ORAL_TABLET | Freq: Every day | ORAL | 1 refills | Status: DC

## 2020-08-10 MED ORDER — BISOPROLOL-HYDROCHLOROTHIAZIDE 5-6.25 MG PO TABS: 1.0000 | ORAL_TABLET | Freq: Every day | ORAL | 2 refills | Status: DC

## 2020-08-10 MED ORDER — AMLODIPINE BESYLATE 10 MG PO TABS: 10.0000 mg | ORAL_TABLET | Freq: Every day | ORAL | 2 refills | Status: DC

## 2020-08-10 NOTE — Patient Instructions (Signed)
It was great to see you!  BP meds refilled. Zofran sent.  Let's follow-up in 6 months, sooner if you have concerns.  Take care,  Jarold Motto PA-C
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
## 2020-09-01 ENCOUNTER — Other Ambulatory Visit: Payer: Self-pay | Admitting: Endocrinology

## 2020-10-08 ENCOUNTER — Telehealth: Payer: Self-pay

## 2020-10-08 ENCOUNTER — Other Ambulatory Visit: Payer: Self-pay

## 2020-10-08 MED ORDER — METFORMIN HCL 500 MG PO TABS
ORAL_TABLET | ORAL | 1 refills | Status: DC
Start: 2020-10-08 — End: 2021-04-19

## 2020-10-08 NOTE — Telephone Encounter (Signed)
Script sent to pharmacy.

## 2020-10-08 NOTE — Telephone Encounter (Signed)
..   LAST APPOINTMENT DATE: 08/10/2020   NEXT APPOINTMENT DATE:@4 /18/2022  MEDICATION:metFORMIN (GLUCOPHAGE) 500 MG tablet

## 2020-10-19 ENCOUNTER — Other Ambulatory Visit: Payer: Self-pay

## 2020-10-19 MED ORDER — OMEPRAZOLE 20 MG PO CPDR: 20.0000 mg | DELAYED_RELEASE_CAPSULE | Freq: Every day | ORAL | 2 refills | Status: DC

## 2020-10-19 NOTE — Telephone Encounter (Signed)
Pt requesting refill for Omeprazole. I do not see where you have ordered it but pt has GERD.

## 2020-10-19 NOTE — Telephone Encounter (Signed)
°  LAST APPOINTMENT DATE: 10/08/2020   NEXT APPOINTMENT DATE:@4 /18/2022  MEDICATION:OMEPRAZOLE 20 MG   PHARMACY:[id number] - [place], [place] - [address] AT [facility] OF [address] & [facility] [address]  **Let patient know to contact pharmacy at the end of the day to make sure medication is ready. **  ** Please notify patient to allow 48-72 hours to process**  **Encourage patient to contact the pharmacy for refills or they can request refills through [facility]**  CLINICAL FILLS OUT ALL BELOW:   LAST REFILL:  QTY:  REFILL DATE:    OTHER COMMENTS:    Okay for refill?  Please advise

## 2020-12-02 ENCOUNTER — Telehealth: Payer: Self-pay

## 2020-12-02 NOTE — Telephone Encounter (Signed)
Nurse Assessment Nurse: Earlene Plater, RN, Candace Date/Time Lamount Cohen Time): 12/01/2020 5:31:22 PM Confirm and document reason for call. If symptomatic, describe symptoms. ---Caller states: he has been having right thigh muscle pain x3 days. Difficult to walk due to pain. Does the patient have any new or worsening symptoms? ---Yes Will a triage be completed? ---Yes Related visit to physician within the last 2 weeks? ---No Does the PT have any chronic conditions? (i.e. diabetes, asthma, this includes High risk factors for pregnancy, etc.) ---Yes List chronic conditions. ---IDDM, Is this a behavioral health or substance abuse call? ---No Guidelines Guideline Title Affirmed Question Affirmed Notes Nurse Date/Time Lamount Cohen Time) Leg Pain Unable to walk Earlene Plater, Charity fundraiser, Candace 12/01/2020 5:37:52 PM Disp. Time Lamount Cohen Time) Disposition Final User 12/01/2020 5:40:02 PM Go to ED Now Yes Earlene Plater, RN, Candace Caller Disagree/Comply Comply Caller Understands Yes PreDisposition InappropriateToAsk Care Advice Given Per Guideline PLEASE NOTE: All timestamps contained within this report are represented as [place] Standard Time. CONFIDENTIALTY NOTICE: This fax transmission is intended only for the addressee. It contains information that is legally privileged, confidential or otherwise protected from use or disclosure. If you are not the intended recipient, you are strictly prohibited from reviewing, disclosing, copying using or disseminating any of this information or taking any action in reliance on or regarding this information. If you have received this fax in error, please notify us immediately by telephone so that we can arrange for its return to [place]. Phone: [phone], Toll-Free: [phone], Fax: [phone] Page: 2 of 2 Call Id: [id number] Care Advice Given Per Guideline GO TO ED NOW: * You need to be seen in the Emergency Department. * Go to the ED at ___________ Hospital. * Leave now. Drive carefully. BRING  MEDICINES: * Bring a list of your current medicines when you go to the Emergency Department (ER). * Bring the pill bottles too. This will help the doctor (or NP/PA) to make certain you are taking the right medicines and the right dose. CARE ADVICE given per Leg Pain (Adult) guideline. Referrals [facility]

## 2020-12-04 NOTE — Telephone Encounter (Signed)
FYI

## 2020-12-27 ENCOUNTER — Other Ambulatory Visit: Payer: Self-pay | Admitting: Physician Assistant

## 2021-02-08 ENCOUNTER — Ambulatory Visit: Payer: Medicare Other | Admitting: Physician Assistant

## 2021-02-09 ENCOUNTER — Ambulatory Visit: Payer: Medicare Other | Admitting: Physician Assistant

## 2021-02-09 DIAGNOSIS — Z0289 Encounter for other administrative examinations: Secondary | ICD-10-CM
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
## 2021-02-19 ENCOUNTER — Encounter: Payer: Self-pay | Admitting: Physician Assistant

## 2021-03-03 ENCOUNTER — Other Ambulatory Visit: Payer: Self-pay | Admitting: Physician Assistant

## 2021-04-17 ENCOUNTER — Other Ambulatory Visit: Payer: Self-pay | Admitting: Physician Assistant

## 2021-04-19 ENCOUNTER — Other Ambulatory Visit: Payer: Self-pay | Admitting: Family Medicine

## 2021-05-04 ENCOUNTER — Other Ambulatory Visit (INDEPENDENT_AMBULATORY_CARE_PROVIDER_SITE_OTHER): Payer: Medicare Other

## 2021-05-04 ENCOUNTER — Other Ambulatory Visit: Payer: Self-pay

## 2021-05-04 ENCOUNTER — Other Ambulatory Visit: Payer: Self-pay | Admitting: *Deleted

## 2021-05-04 ENCOUNTER — Ambulatory Visit (INDEPENDENT_AMBULATORY_CARE_PROVIDER_SITE_OTHER): Payer: Medicare Other

## 2021-05-04 DIAGNOSIS — Z0184 Encounter for antibody response examination: Secondary | ICD-10-CM

## 2021-05-04 DIAGNOSIS — Z111 Encounter for screening for respiratory tuberculosis: Secondary | ICD-10-CM

## 2021-05-04 NOTE — Progress Notes (Signed)
Per orders of Dr. Jimmey Ralph, injection of Tuberculin given by Yolanda Manges in arm. Patient tolerated injection well. Patient will make appointment for 2 days to have ppd read. Marland Kitchen

## 2021-05-06 ENCOUNTER — Ambulatory Visit (INDEPENDENT_AMBULATORY_CARE_PROVIDER_SITE_OTHER): Payer: Medicare Other

## 2021-05-06 ENCOUNTER — Other Ambulatory Visit: Payer: Self-pay | Admitting: *Deleted

## 2021-05-06 ENCOUNTER — Other Ambulatory Visit: Payer: Self-pay

## 2021-05-06 DIAGNOSIS — Z23 Encounter for immunization: Secondary | ICD-10-CM | POA: Diagnosis not present

## 2021-05-06 DIAGNOSIS — Z111 Encounter for screening for respiratory tuberculosis: Secondary | ICD-10-CM

## 2021-05-06 DIAGNOSIS — Z0184 Encounter for antibody response examination: Secondary | ICD-10-CM

## 2021-05-06 LAB — TB SKIN TEST
Induration: 0 mm
TB Skin Test: NEGATIVE

## 2021-05-06 NOTE — Progress Notes (Signed)
Please inform patient of the following:  His labs show immunity to varicella and MMR.  He has no immunity for hepatitis B.  We will need to start hepatitis B vaccine series if needed.

## 2021-05-06 NOTE — Progress Notes (Signed)
Lab ordered.

## 2021-05-07 LAB — HEPATITIS B SURFACE ANTIBODY, QUANTITATIVE: Hep B S AB Quant (Post): <5 m[IU]/mL — ABNORMAL LOW (ref >=10)

## 2021-05-07 LAB — MEASLES/MUMPS/RUBELLA IMMUNITY
Mumps IgG: 110 AU/mL
Rubella: 6.68 Index
Rubeola IgG: 254 AU/mL

## 2021-05-07 LAB — TEST AUTHORIZATION

## 2021-05-07 LAB — VARICELLA ZOSTER ANTIBODY, IGG: Varicella IgG: 607.8 index

## 2021-05-07 LAB — HEPATITIS B SURFACE ANTIGEN: Hepatitis B Surface Ag: NONREACTIVE

## 2021-05-07 LAB — HEPATITIS B SURFACE ANTIBODY,QUALITATIVE: Hep B S Ab: NONREACTIVE

## 2021-05-11 ENCOUNTER — Ambulatory Visit (INDEPENDENT_AMBULATORY_CARE_PROVIDER_SITE_OTHER): Payer: Medicare Other

## 2021-05-11 ENCOUNTER — Other Ambulatory Visit: Payer: Self-pay

## 2021-05-11 DIAGNOSIS — Z111 Encounter for screening for respiratory tuberculosis: Secondary | ICD-10-CM

## 2021-05-11 NOTE — Progress Notes (Signed)
Pt has PPD placed in the office today. He will return on Thursday, 05/13/2021 to have site read.

## 2021-05-13 ENCOUNTER — Ambulatory Visit: Payer: Medicare Other

## 2021-05-20 ENCOUNTER — Other Ambulatory Visit: Payer: Self-pay | Admitting: Physician Assistant

## 2021-06-02 ENCOUNTER — Encounter: Payer: Self-pay | Admitting: Physician Assistant

## 2021-06-03 ENCOUNTER — Other Ambulatory Visit: Payer: Self-pay

## 2021-06-03 ENCOUNTER — Other Ambulatory Visit: Payer: Self-pay | Admitting: *Deleted

## 2021-06-03 ENCOUNTER — Other Ambulatory Visit: Payer: Medicare Other

## 2021-06-03 DIAGNOSIS — Z111 Encounter for screening for respiratory tuberculosis: Secondary | ICD-10-CM

## 2021-06-04 ENCOUNTER — Other Ambulatory Visit: Payer: Self-pay | Admitting: Family Medicine

## 2021-06-07 LAB — QUANTIFERON-TB GOLD PLUS
Mitogen-NIL: >10 IU/mL
NIL: 0.05 IU/mL
QuantiFERON-TB Gold Plus: NEGATIVE
TB1-NIL: 0.06 IU/mL
TB2-NIL: 0.05 IU/mL

## 2021-06-14 ENCOUNTER — Other Ambulatory Visit: Payer: Self-pay | Admitting: Physician Assistant

## 2021-07-06 ENCOUNTER — Other Ambulatory Visit: Payer: Self-pay | Admitting: Physician Assistant

## 2021-08-19 ENCOUNTER — Encounter: Payer: Self-pay | Admitting: Physician Assistant

## 2021-08-20 ENCOUNTER — Encounter: Payer: Self-pay | Admitting: Physician Assistant

## 2021-09-26 ENCOUNTER — Other Ambulatory Visit: Payer: Self-pay | Admitting: Physician Assistant

## 2021-09-28 ENCOUNTER — Encounter: Payer: Self-pay | Admitting: Physician Assistant

## 2021-10-09 ENCOUNTER — Other Ambulatory Visit: Payer: Self-pay | Admitting: Physician Assistant

## 2021-10-14 ENCOUNTER — Other Ambulatory Visit: Payer: Self-pay | Admitting: Physician Assistant

## 2021-10-27 ENCOUNTER — Encounter: Payer: Self-pay | Admitting: Physician Assistant

## 2021-10-27 ENCOUNTER — Other Ambulatory Visit: Payer: Self-pay

## 2021-10-27 DIAGNOSIS — M79605 Pain in left leg: Secondary | ICD-10-CM

## 2021-10-27 DIAGNOSIS — M7918 Myalgia, other site: Secondary | ICD-10-CM

## 2021-10-28 ENCOUNTER — Ambulatory Visit (INDEPENDENT_AMBULATORY_CARE_PROVIDER_SITE_OTHER): Payer: Medicare Other | Admitting: Sports Medicine

## 2021-10-28 ENCOUNTER — Other Ambulatory Visit: Payer: Self-pay

## 2021-10-28 ENCOUNTER — Ambulatory Visit (INDEPENDENT_AMBULATORY_CARE_PROVIDER_SITE_OTHER): Payer: Medicare Other

## 2021-10-28 ENCOUNTER — Other Ambulatory Visit: Payer: Self-pay | Admitting: Sports Medicine

## 2021-10-28 VITALS — BP 132/80 | HR 83 | Ht 72.0 in | Wt 270.0 lb

## 2021-10-28 DIAGNOSIS — G8929 Other chronic pain: Secondary | ICD-10-CM

## 2021-10-28 DIAGNOSIS — M5442 Lumbago with sciatica, left side: Secondary | ICD-10-CM | POA: Diagnosis not present

## 2021-10-28 MED ORDER — MELOXICAM 15 MG PO TABS: 15.0000 mg | ORAL_TABLET | Freq: Every day | ORAL | 0 refills | Status: DC

## 2021-10-28 NOTE — Patient Instructions (Addendum)
Good to see you  Meloxicam 15 mg daily for 2-3 weeks  Sciatica/ piriformis HEP 3 week follow up
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
## 2021-10-29 ENCOUNTER — Ambulatory Visit: Payer: Medicare Other | Admitting: Physician Assistant

## 2021-11-15 ENCOUNTER — Other Ambulatory Visit: Payer: Self-pay | Admitting: Sports Medicine
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
## 2021-11-16 ENCOUNTER — Other Ambulatory Visit: Payer: Self-pay

## 2021-11-16 ENCOUNTER — Ambulatory Visit (INDEPENDENT_AMBULATORY_CARE_PROVIDER_SITE_OTHER): Payer: Medicare Other | Admitting: Sports Medicine

## 2021-11-16 VITALS — BP 132/80 | HR 85 | Ht 72.0 in | Wt 270.0 lb

## 2021-11-16 DIAGNOSIS — G8929 Other chronic pain: Secondary | ICD-10-CM | POA: Diagnosis not present

## 2021-11-16 DIAGNOSIS — M5442 Lumbago with sciatica, left side: Secondary | ICD-10-CM

## 2021-11-16 MED ORDER — CYCLOBENZAPRINE HCL 5 MG PO TABS: 5.0000 mg | ORAL_TABLET | Freq: Every evening | ORAL | 0 refills | Status: DC

## 2021-11-16 NOTE — Patient Instructions (Addendum)
Good to see you  Discontinue meloxicam daily Flexeril 5 mg nightly for muscle spasm  Use Tylenol 500 mg 2 tablets 2-3 times day for day to day pain  Continue HEP Call us in 2 weeks to let us know if improving , if not improvement call us and let us know so we can refer to PT
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
## 2021-12-05 ENCOUNTER — Other Ambulatory Visit: Payer: Self-pay | Admitting: Sports Medicine

## 2021-12-06 ENCOUNTER — Ambulatory Visit (INDEPENDENT_AMBULATORY_CARE_PROVIDER_SITE_OTHER): Payer: Medicare Other | Admitting: Sports Medicine

## 2021-12-06 ENCOUNTER — Other Ambulatory Visit: Payer: Self-pay

## 2021-12-06 VITALS — BP 132/80 | HR 94 | Ht 72.0 in | Wt 254.0 lb

## 2021-12-06 DIAGNOSIS — G8929 Other chronic pain: Secondary | ICD-10-CM | POA: Diagnosis not present

## 2021-12-06 DIAGNOSIS — M5442 Lumbago with sciatica, left side: Secondary | ICD-10-CM | POA: Diagnosis not present

## 2021-12-06 DIAGNOSIS — G5702 Lesion of sciatic nerve, left lower limb: Secondary | ICD-10-CM

## 2021-12-06 MED ORDER — CYCLOBENZAPRINE HCL 5 MG PO TABS: 5.0000 mg | ORAL_TABLET | Freq: Every day | ORAL | 0 refills | Status: DC

## 2021-12-06 MED ORDER — CYCLOBENZAPRINE HCL 10 MG PO TABS: 10.0000 mg | ORAL_TABLET | Freq: Every evening | ORAL | 0 refills | Status: DC | PRN

## 2021-12-06 NOTE — Patient Instructions (Addendum)
Good to see you  Refill of Flexeril  Continue HEP for sciatica and piriformis As needed follow up

## 2022-02-18 ENCOUNTER — Other Ambulatory Visit: Payer: Self-pay | Admitting: Physician Assistant

## 2022-02-22 ENCOUNTER — Ambulatory Visit (INDEPENDENT_AMBULATORY_CARE_PROVIDER_SITE_OTHER): Payer: Medicare Other | Admitting: Physician Assistant

## 2022-02-22 ENCOUNTER — Encounter: Payer: Self-pay | Admitting: Physician Assistant

## 2022-02-22 VITALS — BP 140/80 | HR 93 | Temp 98.1°F | Ht 72.0 in | Wt 259.2 lb

## 2022-02-22 DIAGNOSIS — K219 Gastro-esophageal reflux disease without esophagitis: Secondary | ICD-10-CM

## 2022-02-22 DIAGNOSIS — I10 Essential (primary) hypertension: Secondary | ICD-10-CM | POA: Diagnosis not present

## 2022-02-22 MED ORDER — OMEPRAZOLE 20 MG PO CPDR: DELAYED_RELEASE_CAPSULE | ORAL | 3 refills | Status: DC

## 2022-02-22 MED ORDER — BISOPROLOL-HYDROCHLOROTHIAZIDE 5-6.25 MG PO TABS: 1.0000 | ORAL_TABLET | Freq: Every day | ORAL | 3 refills | Status: DC

## 2022-02-22 MED ORDER — AMLODIPINE BESYLATE 10 MG PO TABS: 10.0000 mg | ORAL_TABLET | Freq: Every day | ORAL | 3 refills | Status: DC
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
## 2022-02-22 NOTE — Patient Instructions (Signed)
It was great to see you! ? ?We will be in touch with your blood work results and our recommendations. ? ?Take care, ? ?Jarold Motto PA-C  ?

## 2022-02-23 LAB — COMPREHENSIVE METABOLIC PANEL
ALT: 39 U/L (ref 0–53)
AST: 27 U/L (ref 0–37)
Albumin: 4.2 g/dL (ref 3.5–5.2)
Alkaline Phosphatase: 52 U/L (ref 39–117)
BUN: 15 mg/dL (ref 6–23)
CO2: 30 mEq/L (ref 19–32)
Calcium: 9.2 mg/dL (ref 8.4–10.5)
Chloride: 97 mEq/L (ref 96–112)
Creatinine, Ser: 0.94 mg/dL (ref 0.40–1.50)
GFR: 92.73 mL/min (ref >60.00)
Glucose, Bld: 282 mg/dL — ABNORMAL HIGH (ref 70–99)
Potassium: 4.4 mEq/L (ref 3.5–5.1)
Sodium: 134 mEq/L — ABNORMAL LOW (ref 135–145)
Total Bilirubin: 0.4 mg/dL (ref 0.2–1.2)
Total Protein: 7.6 g/dL (ref 6.0–8.3)

## 2022-02-23 LAB — LIPID PANEL
Cholesterol: 161 mg/dL (ref 0–200)
HDL: 46.3 mg/dL (ref >39.00)
LDL Cholesterol: 93 mg/dL (ref 0–99)
NonHDL: 115
Total CHOL/HDL Ratio: 3
Triglycerides: 109 mg/dL (ref 0.0–149.0)
VLDL: 21.8 mg/dL (ref 0.0–40.0)

## 2022-06-13 ENCOUNTER — Encounter: Payer: Self-pay | Admitting: Physician Assistant

## 2022-06-13 ENCOUNTER — Ambulatory Visit (INDEPENDENT_AMBULATORY_CARE_PROVIDER_SITE_OTHER): Payer: Medicare Other | Admitting: Physician Assistant

## 2022-06-13 VITALS — BP 150/90 | HR 111 | Temp 97.8°F | Ht 72.0 in | Wt 259.0 lb

## 2022-06-13 DIAGNOSIS — M79605 Pain in left leg: Secondary | ICD-10-CM

## 2022-06-13 DIAGNOSIS — Z7409 Other reduced mobility: Secondary | ICD-10-CM | POA: Diagnosis not present

## 2022-06-13 MED ORDER — ONDANSETRON HCL 4 MG PO TABS: 4.0000 mg | ORAL_TABLET | Freq: Three times a day (TID) | ORAL | 1 refills | Status: DC | PRN
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
## 2022-06-14 ENCOUNTER — Telehealth: Payer: Self-pay | Admitting: Sports Medicine

## 2022-06-14 ENCOUNTER — Encounter: Payer: Self-pay | Admitting: Physician Assistant

## 2022-06-14 ENCOUNTER — Other Ambulatory Visit: Payer: Self-pay | Admitting: Sports Medicine

## 2022-06-14 DIAGNOSIS — G5702 Lesion of sciatic nerve, left lower limb: Secondary | ICD-10-CM

## 2022-06-14 DIAGNOSIS — G8929 Other chronic pain: Secondary | ICD-10-CM

## 2022-06-14 NOTE — Telephone Encounter (Signed)
Pt still having issues with sciatica. Has tried home exercise but no change in symptoms.  Pt advised Dr. Jean Rosenthal offered to order Physical Therapy if no improvement. Pt would like PT ordered. I informed him a visit may be needed.

## 2022-06-14 NOTE — Telephone Encounter (Signed)
Pt notified via V/M

## 2022-06-14 NOTE — Telephone Encounter (Signed)
PT referral has been put in for [facility]

## 2022-06-16 NOTE — Telephone Encounter (Signed)
Andrey Campanile ph# [phone] with [employer] states she faxed a pricing order on 06/16/22 at 8:30 am to fax# [phone] to be signed, dated and faxed back to fax# on the pricing order form.

## 2022-06-17 NOTE — Telephone Encounter (Signed)
Received fax, signed by Lelon Mast and faxed back.

## 2022-08-05 ENCOUNTER — Ambulatory Visit: Payer: Medicare Other

## 2022-08-11 ENCOUNTER — Ambulatory Visit: Payer: Medicare Other | Attending: Sports Medicine

## 2022-08-11 ENCOUNTER — Other Ambulatory Visit: Payer: Self-pay

## 2022-08-11 DIAGNOSIS — M5442 Lumbago with sciatica, left side: Secondary | ICD-10-CM | POA: Diagnosis not present

## 2022-08-11 DIAGNOSIS — G5702 Lesion of sciatic nerve, left lower limb: Secondary | ICD-10-CM | POA: Insufficient documentation

## 2022-08-11 DIAGNOSIS — R2689 Other abnormalities of gait and mobility: Secondary | ICD-10-CM | POA: Insufficient documentation

## 2022-08-11 DIAGNOSIS — M6281 Muscle weakness (generalized): Secondary | ICD-10-CM | POA: Diagnosis present

## 2022-08-11 DIAGNOSIS — G8929 Other chronic pain: Secondary | ICD-10-CM | POA: Insufficient documentation

## 2022-08-11 DIAGNOSIS — M5459 Other low back pain: Secondary | ICD-10-CM | POA: Insufficient documentation
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
## 2022-08-17 ENCOUNTER — Ambulatory Visit: Payer: Medicare Other
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
## 2022-08-24 ENCOUNTER — Ambulatory Visit: Payer: Medicare Other | Attending: Sports Medicine

## 2022-08-24 DIAGNOSIS — M5459 Other low back pain: Secondary | ICD-10-CM | POA: Diagnosis present

## 2022-08-24 DIAGNOSIS — R2689 Other abnormalities of gait and mobility: Secondary | ICD-10-CM | POA: Insufficient documentation

## 2022-08-24 DIAGNOSIS — M6281 Muscle weakness (generalized): Secondary | ICD-10-CM | POA: Diagnosis present
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
## 2022-08-30 NOTE — Telephone Encounter (Signed)
Called  [phone] spoke to [place], told her calling about message from [place]. Corrine said they received the fax that was sent over today. Told her okay.

## 2022-08-30 NOTE — Telephone Encounter (Signed)
Caller states: -Received updated form from PCP, re-faxed this for PCP to sign and date  - Need this re-signed so they can send update pricing forms to insurance for approval  - Re-faxed form on 11/06 to [phone]  Caller requests: -Form be completed and signed by PCP and re-faxed   For additional questions Lovey Newcomer can be reached at [phone]. Reference number is [id number].

## 2022-09-01 ENCOUNTER — Ambulatory Visit: Payer: Medicare Other
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
## 2022-09-08 ENCOUNTER — Ambulatory Visit: Payer: Medicare Other
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
## 2022-09-22 ENCOUNTER — Ambulatory Visit: Payer: Medicare Other

## 2022-09-22 DIAGNOSIS — M5459 Other low back pain: Secondary | ICD-10-CM

## 2022-09-22 DIAGNOSIS — R2689 Other abnormalities of gait and mobility: Secondary | ICD-10-CM

## 2022-09-22 DIAGNOSIS — M6281 Muscle weakness (generalized): Secondary | ICD-10-CM
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
## 2022-09-29 ENCOUNTER — Ambulatory Visit: Payer: Medicare Other | Attending: Sports Medicine

## 2022-11-05 ENCOUNTER — Other Ambulatory Visit: Payer: Self-pay | Admitting: Physician Assistant

## 2023-04-04 ENCOUNTER — Other Ambulatory Visit: Payer: Self-pay | Admitting: Physician Assistant

## 2023-08-18 ENCOUNTER — Other Ambulatory Visit: Payer: Self-pay | Admitting: Physician Assistant

## 2023-10-02 ENCOUNTER — Other Ambulatory Visit: Payer: Self-pay | Admitting: Physician Assistant

## 2023-11-10 ENCOUNTER — Other Ambulatory Visit: Payer: Self-pay | Admitting: Physician Assistant

## 2023-11-23 LAB — COLOGUARD: COLOGUARD: NEGATIVE

## 2024-01-13 ENCOUNTER — Other Ambulatory Visit: Payer: Self-pay | Admitting: Physician Assistant

## 2024-01-31 ENCOUNTER — Other Ambulatory Visit: Payer: Self-pay | Admitting: Physician Assistant

## 2024-08-12 NOTE — Progress Notes (Signed)
 Diabetes Education: Dx: Type 2 diabetes mellitus with hyperglycemia, with long-term current use of insulin    James Terry is here today for diabetes education follow up. Last seen by provider, Warren Batty, FNP on 05/09/24.   Learning needs include: nutritional management, pump prep  Time In: 1600 Time Out: 1700  DSME Patient Education: DSME Learning Language Preferred: English Was an interpreter used?: No  Topics of Knowledge Assessment Pathophysiology: Needs reinforcement Healthy Eating: Needs reinforcement Being Active: Needs reinforcement Taking Medications: Needs reinforcement Home glucose monitoring: Needs reinforcement Lifestyle & Healthy Coping: Needs reinforcement Diabetes Distress & Support: Needs reinforcement Acute Complications: Needs reinforcement Chronic Complications: Needs reinforcement  Monitoring: James Terry is monitoring BG's with Dexcom G7. He is using the Dexcom receiver at this time, but does have a compatible phone (iPhone). Unable to download Dexcom receiver today.  Lab Results  Component Value Date   HGBA1C 11.5 (A) 08/24/2022   POCA1C 8.2 (A) 05/09/2024    Medications: Tresiba 50 units daily Humalog  ~10 units per meal (adjusts dose based on carb intake) TDD: ~80 units  Non insulin : Victoza  1.8 mg daily Metformin  500 mg BD  James Terry has received Omnipod 5 insulin  pump supplies. He does not have insulin  with him today. He is uncertain if he is ready to start pump at this time.  Meal Planning: James Terry has not been counting carbs or keeping a food journal. He is familiar with which food contains carbs. He continues to report difficulty with maintaining a consistent eating pattern. He has been trying to limit carb intake but admits this has been challenging. He does try to take insulin  prior to eating but often forgets until during or after the meal.  Physical Activity:  Did not discuss in detail today  Resources: - Planning healthy meals -  Weekly logs  Education: - Carbohydrate counting             - Carbohydrate foods and portion sizes, grams per serving             - Reviewed standard carb counts, 1 serving = 15 grams             - Nutrition label reading             - Goal 45-60 grams of carbohydrates per meal             - Phone apps to assist with carb counting - Importance of carb counting / consistent carbohydrate intake with use of insulin  pump. Patient prefers to use carb counting method for bolusing rather than set prandial doses. - Reviewed basics of insulin  pump therapy  - Basal vs bolus insulin   - Pod sites and changing pod every 2-3 days  - Automated insulin  delivery with Dexcom + Omnipod - Omnipod & [employer] accounts created and linked with clinic. - Pump start expectations. Appointments scheduled for pump start. Patient agrees to practice carb counting and bring completed food logs to appointment.  Healthy Eating Goals Does the patient have a goal of healthy eating: 1 Goal 1st Healthy Eating Goal: practice carb counting and keep log Measurement of Goals Met: 0% Was Goal Met: Not Met  Self Management Goals Does the patient have an additional self-management goal: 1 Goal 1st Additional Self Management Goal: start insulin  pump Measurement of Goals Met: 0% Was Goal Met: Not Met Behavioral Outcome Objective: Diabetes disease process and Treatment options  Support Plan Support Plan(s): Attend diabetes education; eat healthy and manage weight if indicated  [employer]  understanding of the above education topics.  Follow up: James Terry, RD, CDCES: 09/23/24 - Omnipod 5 pump start Warren Batty, FNP: 09/10/24

## 2024-08-20 ENCOUNTER — Observation Stay (HOSPITAL_COMMUNITY)
Admission: EM | Admit: 2024-08-20 | Discharge: 2024-08-21 | Disposition: A | Discharge status: Home / Self Care | Attending: Internal Medicine | Admitting: Internal Medicine

## 2024-08-20 ENCOUNTER — Encounter (HOSPITAL_COMMUNITY): Payer: Self-pay | Admitting: Emergency Medicine

## 2024-08-20 ENCOUNTER — Emergency Department (HOSPITAL_COMMUNITY)

## 2024-08-20 ENCOUNTER — Other Ambulatory Visit: Payer: Self-pay

## 2024-08-20 DIAGNOSIS — J45901 Unspecified asthma with (acute) exacerbation: Secondary | ICD-10-CM | POA: Insufficient documentation

## 2024-08-20 DIAGNOSIS — F17210 Nicotine dependence, cigarettes, uncomplicated: Secondary | ICD-10-CM | POA: Diagnosis not present

## 2024-08-20 DIAGNOSIS — Z86718 Personal history of other venous thrombosis and embolism: Secondary | ICD-10-CM | POA: Diagnosis not present

## 2024-08-20 DIAGNOSIS — Z8669 Personal history of other diseases of the nervous system and sense organs: Secondary | ICD-10-CM

## 2024-08-20 DIAGNOSIS — I160 Hypertensive urgency: Secondary | ICD-10-CM | POA: Diagnosis not present

## 2024-08-20 DIAGNOSIS — J189 Pneumonia, unspecified organism: Secondary | ICD-10-CM | POA: Diagnosis not present

## 2024-08-20 DIAGNOSIS — J9601 Acute respiratory failure with hypoxia: Principal | ICD-10-CM

## 2024-08-20 DIAGNOSIS — R9431 Abnormal electrocardiogram [ECG] [EKG]: Secondary | ICD-10-CM | POA: Insufficient documentation

## 2024-08-20 DIAGNOSIS — J4521 Mild intermittent asthma with (acute) exacerbation: Secondary | ICD-10-CM

## 2024-08-20 DIAGNOSIS — Z794 Long term (current) use of insulin: Secondary | ICD-10-CM | POA: Insufficient documentation

## 2024-08-20 DIAGNOSIS — Z7901 Long term (current) use of anticoagulants: Secondary | ICD-10-CM | POA: Diagnosis not present

## 2024-08-20 DIAGNOSIS — I2489 Other forms of acute ischemic heart disease: Secondary | ICD-10-CM | POA: Insufficient documentation

## 2024-08-20 DIAGNOSIS — R7989 Other specified abnormal findings of blood chemistry: Secondary | ICD-10-CM | POA: Diagnosis not present

## 2024-08-20 DIAGNOSIS — Z87828 Personal history of other (healed) physical injury and trauma: Secondary | ICD-10-CM | POA: Diagnosis not present

## 2024-08-20 DIAGNOSIS — I10 Essential (primary) hypertension: Secondary | ICD-10-CM | POA: Diagnosis present

## 2024-08-20 DIAGNOSIS — E119 Type 2 diabetes mellitus without complications: Secondary | ICD-10-CM | POA: Diagnosis not present

## 2024-08-20 DIAGNOSIS — K219 Gastro-esophageal reflux disease without esophagitis: Secondary | ICD-10-CM | POA: Diagnosis not present

## 2024-08-20 DIAGNOSIS — R0602 Shortness of breath: Secondary | ICD-10-CM | POA: Diagnosis present

## 2024-08-20 DIAGNOSIS — Z79899 Other long term (current) drug therapy: Secondary | ICD-10-CM | POA: Diagnosis not present

## 2024-08-20 DIAGNOSIS — Z1152 Encounter for screening for COVID-19: Secondary | ICD-10-CM | POA: Insufficient documentation

## 2024-08-20 DIAGNOSIS — G8194 Hemiplegia, unspecified affecting left nondominant side: Secondary | ICD-10-CM | POA: Insufficient documentation

## 2024-08-20 DIAGNOSIS — R071 Chest pain on breathing: Secondary | ICD-10-CM | POA: Insufficient documentation

## 2024-08-20 DIAGNOSIS — R0902 Hypoxemia: Principal | ICD-10-CM

## 2024-08-20 DIAGNOSIS — Z720 Tobacco use: Secondary | ICD-10-CM

## 2024-08-20 LAB — BASIC METABOLIC PANEL WITH GFR
Anion gap: 9 (ref 5–15)
BUN: 13 mg/dL (ref 6–20)
CO2: 29 mmol/L (ref 22–32)
Calcium: 9.1 mg/dL (ref 8.9–10.3)
Chloride: 99 mmol/L (ref 98–111)
Creatinine, Ser: 0.97 mg/dL (ref 0.61–1.24)
GFR, Estimated: >60 mL/min (ref >60)
Glucose, Bld: 161 mg/dL — ABNORMAL HIGH (ref 70–99)
Potassium: 4.3 mmol/L (ref 3.5–5.1)
Sodium: 137 mmol/L (ref 135–145)

## 2024-08-20 LAB — RESP PANEL BY RT-PCR (RSV, FLU A&B, COVID)  RVPGX2
Influenza A by PCR: NEGATIVE
Influenza B by PCR: NEGATIVE
Resp Syncytial Virus by PCR: NEGATIVE
SARS Coronavirus 2 by RT PCR: NEGATIVE

## 2024-08-20 LAB — CBC
HCT: 52 % (ref 39.0–52.0)
Hemoglobin: 15.8 g/dL (ref 13.0–17.0)
MCH: 24.6 pg — ABNORMAL LOW (ref 26.0–34.0)
MCHC: 30.4 g/dL (ref 30.0–36.0)
MCV: 81.1 fL (ref 80.0–100.0)
Platelets: 244 K/uL (ref 150–400)
RBC: 6.41 MIL/uL — ABNORMAL HIGH (ref 4.22–5.81)
RDW: 17.7 % — ABNORMAL HIGH (ref 11.5–15.5)
WBC: 7.2 K/uL (ref 4.0–10.5)
nRBC: 0 % (ref 0.0–0.2)

## 2024-08-20 LAB — TROPONIN T, HIGH SENSITIVITY
Troponin T High Sensitivity: 25 ng/L — ABNORMAL HIGH (ref 0–19)
Troponin T High Sensitivity: 26 ng/L — ABNORMAL HIGH (ref 0–19)

## 2024-08-20 LAB — PRO BRAIN NATRIURETIC PEPTIDE: Pro Brain Natriuretic Peptide: 562 pg/mL — ABNORMAL HIGH (ref <300.0)

## 2024-08-20 LAB — I-STAT CG4 LACTIC ACID, ED: Lactic Acid, Venous: 0.8 mmol/L (ref 0.5–1.9)

## 2024-08-20 MED ORDER — IPRATROPIUM-ALBUTEROL 0.5-2.5 (3) MG/3ML IN SOLN
3.0000 mL | Freq: Four times a day (QID) | RESPIRATORY_TRACT | Status: DC
  Administered 2024-08-21 (×2): 3 mL via RESPIRATORY_TRACT
  Filled 2024-08-20 (×2): qty 3

## 2024-08-20 MED ORDER — LISINOPRIL 10 MG PO TABS
20.0000 mg | ORAL_TABLET | Freq: Every day | ORAL | Status: DC
  Administered 2024-08-21: 20 mg via ORAL
  Filled 2024-08-20: qty 2

## 2024-08-20 MED ORDER — SODIUM CHLORIDE 0.9 % IV SOLN
2.0000 g | INTRAVENOUS | Status: DC
  Administered 2024-08-21: 2 g via INTRAVENOUS
  Filled 2024-08-20: qty 20

## 2024-08-20 MED ORDER — SODIUM CHLORIDE 0.9% FLUSH
3.0000 mL | INTRAVENOUS | Status: DC | PRN
Start: 2024-08-20 — End: 2024-08-21

## 2024-08-20 MED ORDER — IPRATROPIUM-ALBUTEROL 0.5-2.5 (3) MG/3ML IN SOLN: 3.0000 mL | Freq: Four times a day (QID) | RESPIRATORY_TRACT | Status: DC | PRN

## 2024-08-20 MED ORDER — SODIUM CHLORIDE 0.9 % IV SOLN
500.0000 mg | Freq: Once | INTRAVENOUS | Status: AC
  Administered 2024-08-20: 500 mg via INTRAVENOUS
  Filled 2024-08-20: qty 5

## 2024-08-20 MED ORDER — HYDROCHLOROTHIAZIDE 12.5 MG PO TABS
12.5000 mg | ORAL_TABLET | Freq: Every day | ORAL | Status: DC
  Administered 2024-08-21: 12.5 mg via ORAL
  Filled 2024-08-20: qty 1

## 2024-08-20 MED ORDER — INSULIN GLARGINE-YFGN 100 UNIT/ML ~~LOC~~ SOLN
50.0000 [IU] | SUBCUTANEOUS | Status: DC
  Administered 2024-08-21: 50 [IU] via SUBCUTANEOUS
  Filled 2024-08-20: qty 0.5

## 2024-08-20 MED ORDER — PANTOPRAZOLE SODIUM 40 MG PO TBEC
40.0000 mg | DELAYED_RELEASE_TABLET | Freq: Every day | ORAL | Status: DC
  Administered 2024-08-21: 40 mg via ORAL
  Filled 2024-08-20: qty 1

## 2024-08-20 MED ORDER — AMLODIPINE BESYLATE 5 MG PO TABS: 10.0000 mg | ORAL_TABLET | Freq: Every day | ORAL | Status: DC

## 2024-08-20 MED ORDER — LISINOPRIL-HYDROCHLOROTHIAZIDE 20-12.5 MG PO TABS: 1.0000 | ORAL_TABLET | Freq: Every day | ORAL | Status: DC

## 2024-08-20 MED ORDER — SODIUM CHLORIDE 0.9 % IV SOLN
250.0000 mL | INTRAVENOUS | Status: DC | PRN
Start: 2024-08-20 — End: 2024-08-21

## 2024-08-20 MED ORDER — ACETAMINOPHEN 325 MG PO TABS: 650.0000 mg | ORAL_TABLET | Freq: Four times a day (QID) | ORAL | Status: DC | PRN

## 2024-08-20 MED ORDER — ACETAMINOPHEN 650 MG RE SUPP: 650.0000 mg | Freq: Four times a day (QID) | RECTAL | Status: DC | PRN

## 2024-08-20 MED ORDER — SODIUM CHLORIDE 0.9% FLUSH
3.0000 mL | Freq: Two times a day (BID) | INTRAVENOUS | Status: DC
  Administered 2024-08-21 (×2): 3 mL via INTRAVENOUS

## 2024-08-20 MED ORDER — INSULIN ASPART 100 UNIT/ML IJ SOLN
0.0000 [IU] | Freq: Three times a day (TID) | INTRAMUSCULAR | Status: DC
  Administered 2024-08-21: 2 [IU] via SUBCUTANEOUS
  Filled 2024-08-20: qty 0.06

## 2024-08-20 MED ORDER — DOXYCYCLINE HYCLATE 100 MG IV SOLR
100.0000 mg | Freq: Two times a day (BID) | INTRAVENOUS | Status: DC
  Administered 2024-08-21: 100 mg via INTRAVENOUS
  Filled 2024-08-20: qty 100

## 2024-08-20 MED ORDER — GUAIFENESIN ER 600 MG PO TB12
600.0000 mg | ORAL_TABLET | Freq: Two times a day (BID) | ORAL | Status: DC
  Administered 2024-08-21 (×2): 600 mg via ORAL
  Filled 2024-08-20 (×2): qty 1

## 2024-08-20 MED ORDER — NICOTINE 14 MG/24HR TD PT24
14.0000 mg | MEDICATED_PATCH | Freq: Every day | TRANSDERMAL | Status: DC
  Administered 2024-08-21 (×2): 14 mg via TRANSDERMAL
  Filled 2024-08-20 (×2): qty 1

## 2024-08-20 MED ORDER — INSULIN ASPART 100 UNIT/ML IJ SOLN
0.0000 [IU] | Freq: Every day | INTRAMUSCULAR | Status: DC
  Administered 2024-08-21: 3 [IU] via SUBCUTANEOUS
  Filled 2024-08-20: qty 0.05

## 2024-08-20 MED ORDER — CEFTRIAXONE SODIUM 1 G IJ SOLR
1.0000 g | Freq: Once | INTRAMUSCULAR | Status: AC
  Administered 2024-08-20: 1 g via INTRAVENOUS
  Filled 2024-08-20: qty 10

## 2024-08-20 MED ORDER — METHYLPREDNISOLONE SODIUM SUCC 40 MG IJ SOLR
40.0000 mg | Freq: Every day | INTRAMUSCULAR | Status: DC
  Administered 2024-08-21: 40 mg via INTRAVENOUS
  Filled 2024-08-20: qty 1

## 2024-08-20 MED ORDER — ENOXAPARIN SODIUM 40 MG/0.4ML IJ SOSY
40.0000 mg | PREFILLED_SYRINGE | INTRAMUSCULAR | Status: DC
  Administered 2024-08-21: 40 mg via SUBCUTANEOUS
  Filled 2024-08-20: qty 0.4

## 2024-08-20 MED ORDER — METHYLPREDNISOLONE SODIUM SUCC 125 MG IJ SOLR
125.0000 mg | Freq: Once | INTRAMUSCULAR | Status: AC
  Administered 2024-08-20: 125 mg via INTRAVENOUS
  Filled 2024-08-20: qty 2

## 2024-08-20 MED ORDER — IOHEXOL 350 MG/ML SOLN
75.0000 mL | Freq: Once | INTRAVENOUS | Status: AC | PRN
  Administered 2024-08-20: 75 mL via INTRAVENOUS

## 2024-08-20 MED ORDER — ONDANSETRON HCL 4 MG PO TABS: 4.0000 mg | ORAL_TABLET | Freq: Four times a day (QID) | ORAL | Status: DC | PRN

## 2024-08-20 MED ORDER — IPRATROPIUM-ALBUTEROL 0.5-2.5 (3) MG/3ML IN SOLN
3.0000 mL | Freq: Once | RESPIRATORY_TRACT | Status: AC
  Administered 2024-08-20: 3 mL via RESPIRATORY_TRACT
  Filled 2024-08-20: qty 3

## 2024-08-20 MED ORDER — ONDANSETRON HCL 4 MG/2ML IJ SOLN: 4.0000 mg | Freq: Four times a day (QID) | INTRAMUSCULAR | Status: DC | PRN
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
## 2024-08-20 NOTE — ED Triage Notes (Signed)
 Pt reports SHOB. Pt stating that it is hard to inhale. Pt also reports cough.
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
## 2024-08-21 ENCOUNTER — Inpatient Hospital Stay (HOSPITAL_COMMUNITY)

## 2024-08-21 DIAGNOSIS — Z8669 Personal history of other diseases of the nervous system and sense organs: Secondary | ICD-10-CM

## 2024-08-21 DIAGNOSIS — J9601 Acute respiratory failure with hypoxia: Secondary | ICD-10-CM | POA: Diagnosis not present

## 2024-08-21 LAB — CBC
HCT: 48.6 % (ref 39.0–52.0)
Hemoglobin: 14.7 g/dL (ref 13.0–17.0)
MCH: 24.5 pg — ABNORMAL LOW (ref 26.0–34.0)
MCHC: 30.2 g/dL (ref 30.0–36.0)
MCV: 81.1 fL (ref 80.0–100.0)
Platelets: 225 K/uL (ref 150–400)
RBC: 5.99 MIL/uL — ABNORMAL HIGH (ref 4.22–5.81)
RDW: 17.2 % — ABNORMAL HIGH (ref 11.5–15.5)
WBC: 7.2 K/uL (ref 4.0–10.5)
nRBC: 0 % (ref 0.0–0.2)

## 2024-08-21 LAB — COMPREHENSIVE METABOLIC PANEL WITH GFR
ALT: 32 U/L (ref 0–44)
AST: 30 U/L (ref 15–41)
Albumin: 3.4 g/dL — ABNORMAL LOW (ref 3.5–5.0)
Alkaline Phosphatase: 50 U/L (ref 38–126)
Anion gap: 9 (ref 5–15)
BUN: 13 mg/dL (ref 6–20)
CO2: 22 mmol/L (ref 22–32)
Calcium: 7.2 mg/dL — ABNORMAL LOW (ref 8.9–10.3)
Chloride: 108 mmol/L (ref 98–111)
Creatinine, Ser: 0.81 mg/dL (ref 0.61–1.24)
GFR, Estimated: >60 mL/min (ref >60)
Glucose, Bld: 203 mg/dL — ABNORMAL HIGH (ref 70–99)
Potassium: 3.6 mmol/L (ref 3.5–5.1)
Sodium: 139 mmol/L (ref 135–145)
Total Bilirubin: 0.3 mg/dL (ref 0.0–1.2)
Total Protein: 5.9 g/dL — ABNORMAL LOW (ref 6.5–8.1)

## 2024-08-21 LAB — CBG MONITORING, ED
Glucose-Capillary: 226 mg/dL — ABNORMAL HIGH (ref 70–99)
Glucose-Capillary: 254 mg/dL — ABNORMAL HIGH (ref 70–99)

## 2024-08-21 LAB — STREP PNEUMONIAE URINARY ANTIGEN: Strep Pneumo Urinary Antigen: NEGATIVE

## 2024-08-21 MED ORDER — HYDRALAZINE HCL 20 MG/ML IJ SOLN: 10.0000 mg | Freq: Four times a day (QID) | INTRAMUSCULAR | Status: DC | PRN

## 2024-08-21 MED ORDER — DOXYCYCLINE HYCLATE 100 MG PO TABS: 100.0000 mg | ORAL_TABLET | Freq: Two times a day (BID) | ORAL | 0 refills | Status: AC

## 2024-08-21 MED ORDER — NICOTINE 14 MG/24HR TD PT24: 14.0000 mg | MEDICATED_PATCH | Freq: Every day | TRANSDERMAL | 0 refills | Status: AC

## 2024-08-21 MED ORDER — AMOXICILLIN-POT CLAVULANATE 875-125 MG PO TABS: 1.0000 | ORAL_TABLET | Freq: Two times a day (BID) | ORAL | 0 refills | Status: AC

## 2024-08-21 MED ORDER — GUAIFENESIN ER 600 MG PO TB12: 600.0000 mg | ORAL_TABLET | Freq: Two times a day (BID) | ORAL | 0 refills | Status: AC

## 2024-08-21 MED ORDER — ALBUTEROL SULFATE HFA 108 (90 BASE) MCG/ACT IN AERS: 2.0000 | INHALATION_SPRAY | RESPIRATORY_TRACT | 0 refills | Status: AC | PRN

## 2024-08-21 MED ORDER — PREDNISONE 10 MG PO TABS: 40.0000 mg | ORAL_TABLET | Freq: Every day | ORAL | 0 refills | Status: AC

## 2024-08-21 MED ORDER — IPRATROPIUM-ALBUTEROL 0.5-2.5 (3) MG/3ML IN SOLN
RESPIRATORY_TRACT | Status: AC
  Filled 2024-08-21: qty 3

## 2024-08-21 NOTE — Care Management CC44 (Signed)
 Condition Code 44 Documentation Completed  Patient Details  Name: Jw Covin MRN: [id number] Date of Birth: [birth year]   Condition Code 44 given:  Yes Patient signature on Condition Code 44 notice:  Yes Documentation of 2 MD's agreement:  Yes Code 44 added to claim:  Yes    [name], LCSW 08/21/2024, 12:46 PM

## 2024-08-21 NOTE — Care Management Obs Status (Signed)
 MEDICARE OBSERVATION STATUS NOTIFICATION   Patient Details  Name: James Terry MRN: [id number] Date of Birth: 1971-01-23   Medicare Observation Status Notification Given:  Yes    [name], LCSW 08/21/2024, 12:46 PM
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
## 2024-08-22 LAB — LEGIONELLA PNEUMOPHILA SEROGP 1 UR AG: L. pneumophila Serogp 1 Ur Ag: NEGATIVE

## 2024-08-26 LAB — CULTURE, BLOOD (ROUTINE X 2)
Culture: NO GROWTH
Special Requests: ADEQUATE

## 2024-09-25 NOTE — Progress Notes (Signed)
 Great - thanks

## 2024-10-05 ENCOUNTER — Emergency Department (HOSPITAL_COMMUNITY)

## 2024-10-05 ENCOUNTER — Inpatient Hospital Stay (HOSPITAL_COMMUNITY)
Admission: EM | Admit: 2024-10-05 | Discharge: 2024-10-08 | DRG: 286 | Disposition: A | Attending: Internal Medicine | Admitting: Internal Medicine

## 2024-10-05 ENCOUNTER — Encounter (HOSPITAL_COMMUNITY): Payer: Self-pay

## 2024-10-05 ENCOUNTER — Other Ambulatory Visit: Payer: Self-pay

## 2024-10-05 ENCOUNTER — Emergency Department (EMERGENCY_DEPARTMENT_HOSPITAL)

## 2024-10-05 DIAGNOSIS — R0602 Shortness of breath: Secondary | ICD-10-CM

## 2024-10-05 DIAGNOSIS — J45909 Unspecified asthma, uncomplicated: Secondary | ICD-10-CM | POA: Diagnosis present

## 2024-10-05 DIAGNOSIS — E119 Type 2 diabetes mellitus without complications: Secondary | ICD-10-CM

## 2024-10-05 DIAGNOSIS — R609 Edema, unspecified: Secondary | ICD-10-CM | POA: Diagnosis not present

## 2024-10-05 DIAGNOSIS — Z7984 Long term (current) use of oral hypoglycemic drugs: Secondary | ICD-10-CM | POA: Diagnosis not present

## 2024-10-05 DIAGNOSIS — I255 Ischemic cardiomyopathy: Secondary | ICD-10-CM | POA: Diagnosis present

## 2024-10-05 DIAGNOSIS — F32A Depression, unspecified: Secondary | ICD-10-CM | POA: Diagnosis present

## 2024-10-05 DIAGNOSIS — Z9641 Presence of insulin pump (external) (internal): Secondary | ICD-10-CM | POA: Diagnosis not present

## 2024-10-05 DIAGNOSIS — S143XXS Injury of brachial plexus, sequela: Secondary | ICD-10-CM | POA: Diagnosis not present

## 2024-10-05 DIAGNOSIS — I5021 Acute systolic (congestive) heart failure: Secondary | ICD-10-CM | POA: Diagnosis not present

## 2024-10-05 DIAGNOSIS — Z794 Long term (current) use of insulin: Secondary | ICD-10-CM | POA: Diagnosis not present

## 2024-10-05 DIAGNOSIS — I5023 Acute on chronic systolic (congestive) heart failure: Secondary | ICD-10-CM | POA: Diagnosis present

## 2024-10-05 DIAGNOSIS — R7989 Other specified abnormal findings of blood chemistry: Secondary | ICD-10-CM | POA: Diagnosis present

## 2024-10-05 DIAGNOSIS — I2489 Other forms of acute ischemic heart disease: Secondary | ICD-10-CM | POA: Diagnosis present

## 2024-10-05 DIAGNOSIS — R0609 Other forms of dyspnea: Secondary | ICD-10-CM | POA: Diagnosis not present

## 2024-10-05 DIAGNOSIS — Z95828 Presence of other vascular implants and grafts: Secondary | ICD-10-CM | POA: Diagnosis not present

## 2024-10-05 DIAGNOSIS — Z7901 Long term (current) use of anticoagulants: Secondary | ICD-10-CM | POA: Diagnosis not present

## 2024-10-05 DIAGNOSIS — I110 Hypertensive heart disease with heart failure: Secondary | ICD-10-CM | POA: Diagnosis present

## 2024-10-05 DIAGNOSIS — Z560 Unemployment, unspecified: Secondary | ICD-10-CM | POA: Diagnosis not present

## 2024-10-05 DIAGNOSIS — F419 Anxiety disorder, unspecified: Secondary | ICD-10-CM | POA: Diagnosis present

## 2024-10-05 DIAGNOSIS — G4733 Obstructive sleep apnea (adult) (pediatric): Secondary | ICD-10-CM | POA: Diagnosis present

## 2024-10-05 DIAGNOSIS — K219 Gastro-esophageal reflux disease without esophagitis: Secondary | ICD-10-CM | POA: Diagnosis present

## 2024-10-05 DIAGNOSIS — E785 Hyperlipidemia, unspecified: Secondary | ICD-10-CM | POA: Diagnosis present

## 2024-10-05 DIAGNOSIS — G8929 Other chronic pain: Secondary | ICD-10-CM | POA: Diagnosis present

## 2024-10-05 DIAGNOSIS — J9601 Acute respiratory failure with hypoxia: Secondary | ICD-10-CM | POA: Diagnosis present

## 2024-10-05 DIAGNOSIS — Z79899 Other long term (current) drug therapy: Secondary | ICD-10-CM | POA: Diagnosis not present

## 2024-10-05 DIAGNOSIS — Z87828 Personal history of other (healed) physical injury and trauma: Secondary | ICD-10-CM | POA: Diagnosis not present

## 2024-10-05 DIAGNOSIS — M21372 Foot drop, left foot: Secondary | ICD-10-CM | POA: Diagnosis present

## 2024-10-05 DIAGNOSIS — I480 Paroxysmal atrial fibrillation: Secondary | ICD-10-CM | POA: Diagnosis present

## 2024-10-05 DIAGNOSIS — I10 Essential (primary) hypertension: Secondary | ICD-10-CM | POA: Diagnosis present

## 2024-10-05 DIAGNOSIS — Z87891 Personal history of nicotine dependence: Secondary | ICD-10-CM | POA: Diagnosis not present

## 2024-10-05 DIAGNOSIS — Z8249 Family history of ischemic heart disease and other diseases of the circulatory system: Secondary | ICD-10-CM | POA: Diagnosis not present

## 2024-10-05 DIAGNOSIS — Z8669 Personal history of other diseases of the nervous system and sense organs: Secondary | ICD-10-CM

## 2024-10-05 LAB — LIPID PANEL
Cholesterol: 182 mg/dL (ref 0–200)
HDL: 50 mg/dL (ref >40)
LDL Cholesterol: 114 mg/dL — ABNORMAL HIGH (ref 0–99)
Total CHOL/HDL Ratio: 3.6 ratio
Triglycerides: 90 mg/dL (ref <150)
VLDL: 18 mg/dL (ref 0–40)

## 2024-10-05 LAB — TROPONIN I (HIGH SENSITIVITY)
Troponin I (High Sensitivity): 25 ng/L — ABNORMAL HIGH (ref <18)
Troponin I (High Sensitivity): 51 ng/L — ABNORMAL HIGH (ref <18)
Troponin I (High Sensitivity): 68 ng/L — ABNORMAL HIGH (ref <18)
Troponin I (High Sensitivity): 40 ng/L — ABNORMAL HIGH (ref <18)

## 2024-10-05 LAB — BASIC METABOLIC PANEL WITH GFR
Anion gap: 14 (ref 5–15)
BUN: 10 mg/dL (ref 6–20)
CO2: 24 mmol/L (ref 22–32)
Calcium: 8.6 mg/dL — ABNORMAL LOW (ref 8.9–10.3)
Chloride: 99 mmol/L (ref 98–111)
Creatinine, Ser: 0.99 mg/dL (ref 0.61–1.24)
GFR, Estimated: >60 mL/min (ref >60)
Glucose, Bld: 191 mg/dL — ABNORMAL HIGH (ref 70–99)
Potassium: 4.9 mmol/L (ref 3.5–5.1)
Sodium: 137 mmol/L (ref 135–145)

## 2024-10-05 LAB — CBC WITH DIFFERENTIAL/PLATELET
Abs Immature Granulocytes: 0.02 K/uL (ref 0.00–0.07)
Basophils Absolute: 0 K/uL (ref 0.0–0.1)
Basophils Relative: 1 %
Eosinophils Absolute: 0.1 K/uL (ref 0.0–0.5)
Eosinophils Relative: 2 %
HCT: 44.2 % (ref 39.0–52.0)
Hemoglobin: 14.2 g/dL (ref 13.0–17.0)
Immature Granulocytes: 0 %
Lymphocytes Relative: 26 %
Lymphs Abs: 1.6 K/uL (ref 0.7–4.0)
MCH: 25.6 pg — ABNORMAL LOW (ref 26.0–34.0)
MCHC: 32.1 g/dL (ref 30.0–36.0)
MCV: 79.8 fL — ABNORMAL LOW (ref 80.0–100.0)
Monocytes Absolute: 0.5 K/uL (ref 0.1–1.0)
Monocytes Relative: 8 %
Neutro Abs: 4.1 K/uL (ref 1.7–7.7)
Neutrophils Relative %: 63 %
Platelets: 193 K/uL (ref 150–400)
RBC: 5.54 MIL/uL (ref 4.22–5.81)
RDW: 16.4 % — ABNORMAL HIGH (ref 11.5–15.5)
WBC: 6.4 K/uL (ref 4.0–10.5)
nRBC: 0 % (ref 0.0–0.2)

## 2024-10-05 LAB — COMPREHENSIVE METABOLIC PANEL WITH GFR
ALT: 40 U/L (ref 0–44)
AST: 37 U/L (ref 15–41)
Albumin: 3 g/dL — ABNORMAL LOW (ref 3.5–5.0)
Alkaline Phosphatase: 48 U/L (ref 38–126)
Anion gap: 7 (ref 5–15)
BUN: 11 mg/dL (ref 6–20)
CO2: 28 mmol/L (ref 22–32)
Calcium: 8.2 mg/dL — ABNORMAL LOW (ref 8.9–10.3)
Chloride: 102 mmol/L (ref 98–111)
Creatinine, Ser: 0.94 mg/dL (ref 0.61–1.24)
GFR, Estimated: >60 mL/min (ref >60)
Glucose, Bld: 214 mg/dL — ABNORMAL HIGH (ref 70–99)
Potassium: 4.1 mmol/L (ref 3.5–5.1)
Sodium: 137 mmol/L (ref 135–145)
Total Bilirubin: 0.4 mg/dL (ref 0.0–1.2)
Total Protein: 6.2 g/dL — ABNORMAL LOW (ref 6.5–8.1)

## 2024-10-05 LAB — BLOOD GAS, VENOUS
Acid-Base Excess: 9 mmol/L — ABNORMAL HIGH (ref 0.0–2.0)
Bicarbonate: 35.9 mmol/L — ABNORMAL HIGH (ref 20.0–28.0)
O2 Saturation: 54.7 %
Patient temperature: 37.1
pCO2, Ven: 58 mmHg (ref 44–60)
pH, Ven: 7.4 (ref 7.25–7.43)
pO2, Ven: 31 mmHg — CL (ref 32–45)

## 2024-10-05 LAB — RESP PANEL BY RT-PCR (RSV, FLU A&B, COVID)  RVPGX2
Influenza A by PCR: NEGATIVE
Influenza B by PCR: NEGATIVE
Resp Syncytial Virus by PCR: NEGATIVE
SARS Coronavirus 2 by RT PCR: NEGATIVE

## 2024-10-05 LAB — GLUCOSE, CAPILLARY
Glucose-Capillary: 190 mg/dL — ABNORMAL HIGH (ref 70–99)
Glucose-Capillary: 185 mg/dL — ABNORMAL HIGH (ref 70–99)
Glucose-Capillary: 221 mg/dL — ABNORMAL HIGH (ref 70–99)

## 2024-10-05 LAB — BRAIN NATRIURETIC PEPTIDE: B Natriuretic Peptide: 159.3 pg/mL — ABNORMAL HIGH (ref 0.0–100.0)

## 2024-10-05 MED ORDER — FUROSEMIDE 10 MG/ML IJ SOLN: 40.0000 mg | Freq: Once | INTRAMUSCULAR | Status: DC

## 2024-10-05 MED ORDER — PANTOPRAZOLE SODIUM 40 MG PO TBEC
40.0000 mg | DELAYED_RELEASE_TABLET | Freq: Every day | ORAL | Status: DC
  Administered 2024-10-05 – 2024-10-08 (×4): 40 mg via ORAL
  Filled 2024-10-05 (×4): qty 1

## 2024-10-05 MED ORDER — POLYETHYLENE GLYCOL 3350 17 G PO PACK: 17.0000 g | PACK | Freq: Every day | ORAL | Status: DC | PRN

## 2024-10-05 MED ORDER — ENOXAPARIN SODIUM 40 MG/0.4ML IJ SOSY
40.0000 mg | PREFILLED_SYRINGE | INTRAMUSCULAR | Status: DC
  Administered 2024-10-06: 40 mg via SUBCUTANEOUS
  Filled 2024-10-05: qty 0.4

## 2024-10-05 MED ORDER — FUROSEMIDE 10 MG/ML IJ SOLN
20.0000 mg | Freq: Once | INTRAMUSCULAR | Status: AC
  Administered 2024-10-05: 20 mg via INTRAVENOUS
  Filled 2024-10-05: qty 2

## 2024-10-05 MED ORDER — INSULIN GLARGINE 100 UNIT/ML ~~LOC~~ SOLN
20.0000 [IU] | Freq: Every day | SUBCUTANEOUS | Status: DC
  Administered 2024-10-05 – 2024-10-06 (×2): 20 [IU] via SUBCUTANEOUS
  Filled 2024-10-05 (×3): qty 0.2

## 2024-10-05 MED ORDER — ZOLPIDEM TARTRATE 5 MG PO TABS
10.0000 mg | ORAL_TABLET | Freq: Every day | ORAL | Status: DC
  Administered 2024-10-05 – 2024-10-07 (×3): 10 mg via ORAL
  Filled 2024-10-05 (×3): qty 2

## 2024-10-05 MED ORDER — INSULIN ASPART 100 UNIT/ML IJ SOLN
0.0000 [IU] | Freq: Three times a day (TID) | INTRAMUSCULAR | Status: DC
  Administered 2024-10-06: 5 [IU] via SUBCUTANEOUS
  Administered 2024-10-06 (×2): 3 [IU] via SUBCUTANEOUS
  Administered 2024-10-08: 09:00:00 5 [IU] via SUBCUTANEOUS
  Administered 2024-10-08: 13:00:00 2 [IU] via SUBCUTANEOUS
  Filled 2024-10-05: qty 2
  Filled 2024-10-05 (×3): qty 3
  Filled 2024-10-05 (×2): qty 5

## 2024-10-05 MED ORDER — METHADONE HCL 10 MG PO TABS
10.0000 mg | ORAL_TABLET | Freq: Three times a day (TID) | ORAL | Status: DC
  Administered 2024-10-05 – 2024-10-08 (×9): 10 mg via ORAL
  Filled 2024-10-05 (×9): qty 1

## 2024-10-05 MED ORDER — ACETAMINOPHEN 325 MG PO TABS: 650.0000 mg | ORAL_TABLET | Freq: Four times a day (QID) | ORAL | Status: DC | PRN

## 2024-10-05 MED ORDER — NICOTINE 14 MG/24HR TD PT24
14.0000 mg | MEDICATED_PATCH | Freq: Every day | TRANSDERMAL | Status: DC
  Administered 2024-10-05 – 2024-10-08 (×4): 14 mg via TRANSDERMAL
  Filled 2024-10-05 (×4): qty 1

## 2024-10-05 MED ORDER — IOHEXOL 350 MG/ML SOLN
75.0000 mL | Freq: Once | INTRAVENOUS | Status: AC | PRN
  Administered 2024-10-05: 75 mL via INTRAVENOUS

## 2024-10-05 MED ORDER — ACETAMINOPHEN 650 MG RE SUPP: 650.0000 mg | Freq: Four times a day (QID) | RECTAL | Status: DC | PRN

## 2024-10-05 MED ORDER — INSULIN ASPART 100 UNIT/ML IJ SOLN
0.0000 [IU] | Freq: Every day | INTRAMUSCULAR | Status: DC
  Administered 2024-10-05 – 2024-10-07 (×2): 2 [IU] via SUBCUTANEOUS
  Filled 2024-10-05: qty 3
  Filled 2024-10-05: qty 2

## 2024-10-05 MED ORDER — POTASSIUM CHLORIDE CRYS ER 20 MEQ PO TBCR
30.0000 meq | EXTENDED_RELEASE_TABLET | ORAL | Status: DC
  Administered 2024-10-05: 30 meq via ORAL
  Filled 2024-10-05: qty 1

## 2024-10-05 NOTE — Inpatient Diabetes Management (Signed)
 Inpatient Diabetes Program Recommendations  AACE/ADA: New Consensus Statement on Inpatient Glycemic Control (2015)  Target Ranges:  Prepandial:   less than 140 mg/dL      Peak postprandial:   less than 180 mg/dL (1-2 hours)      Critically ill patients:  140 - 180 mg/dL   Lab Results  Component Value Date   GLUCAP 190 (H) 10/05/2024   HGBA1C 8.6 (H) 10/15/2019    Review of Glycemic Control  Diabetes history: DM2 Outpatient Diabetes medications: omnipod 5 Insulin  pump ( see settings from MD office in notes) Current orders for Inpatient glycemic control: Novolog  0-15 units tid, 0-5 units hs  Inpatient Diabetes Program Recommendations:    Please consider: -Add Lantus  20 units daily (total basal per pump = 34.8 units) -Novolog  4 units tid meal coverage if eats 50% meal -Add carb mod to diet  Spoke with RN and patient does not have his insulin  pump on. Recommend having family bring insulin  pump and supplies to have ready when restarting his pump. Will need to restart insulin  pump 24 hrs. After basal insulin  given. From [facility] note on 09/25/24: Omnipod 5 insulin  pump started on 09/23/24. Pod changed today without issue, filled with 200 units of insulin  and placed on abdomen.  Changes made to settings today: ICR: 7 -> 6  Insulin  Instructions Pump Settings  insulin  lispro 100 unit/mL injection (HumaLOG )   AIT: 4 hours Max basal: 4.35 Max bolus: 30 units Basal Rate  Total Basal Dose: 34.8 units/day  Time units/hr  12:00 AM 1.45   Blood Glucose Target  Time mg/dL  87:99 AM 869 - 869   Sensitivity Factor  Time mg/dL/unit  87:99 AM 25   Carb Ratio  Time g/unit  12:00 AM 6   Thank you, Aharon Carriere E. Boruch Manuele, RN, MSN, CNS, CDCES  Diabetes Coordinator Inpatient Glycemic Control Team Team Pager [phone] (8am-5pm) 10/05/2024 4:36 PM
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
## 2024-10-05 NOTE — Progress Notes (Signed)
 Patient arrived to unit from ED with multiple concerns regarding pain medication he has been taking at home; methadone  10mg  PO TID, not currently ordered.  Patient also refused collection of vitals signs or placement on telemetry until he is allowed to shower.  Aside from left arm pain (chronic), patient was reporting no overt symptoms; no chest pain, SOB, lightheadedness or dizziness noted.  His body language and tone of voice were very tense, however.  In order to avoid any potential escalation, assisted patient to secure and cover his IV saline lock and provided personal care items to allow him to shower independently.  Instructed patient/family in use of call light and requested he/she call when shower is complete to allow us  to place telemetry and obtain vital signs.  Notified charge RN, Lauraine Companion and primary RN, Erby as well as attending, Dr. Francesco by secure message to make all aware.

## 2024-10-05 NOTE — Progress Notes (Signed)
 VASCULAR LAB    Left lower extremity has been performed.  See CV proc for preliminary results.  Relayed results to Dr. Freddi via secure chat  RACHEL PELLET, RVT 10/05/2024, 9:33 AM
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
## 2024-10-05 NOTE — ED Notes (Signed)
 Pt in ultrasound
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
## 2024-10-05 NOTE — ED Triage Notes (Signed)
 Pt to er via ems, per ems pt is here for shortness of breath, states that he was found with a decreased O2 sat, states that he was recently at [facility] for a pneumonia.  Pt talking in full sentences, pt c/o sob and some swelling in his legs.  MD at bedside.

## 2024-10-05 NOTE — Plan of Care (Signed)

## 2024-10-06 ENCOUNTER — Inpatient Hospital Stay (HOSPITAL_COMMUNITY)

## 2024-10-06 DIAGNOSIS — I5021 Acute systolic (congestive) heart failure: Secondary | ICD-10-CM

## 2024-10-06 DIAGNOSIS — R0609 Other forms of dyspnea: Secondary | ICD-10-CM

## 2024-10-06 DIAGNOSIS — J9601 Acute respiratory failure with hypoxia: Principal | ICD-10-CM | POA: Diagnosis present

## 2024-10-06 LAB — CBC
HCT: 46.2 % (ref 39.0–52.0)
Hemoglobin: 14.8 g/dL (ref 13.0–17.0)
MCH: 25.8 pg — ABNORMAL LOW (ref 26.0–34.0)
MCHC: 32 g/dL (ref 30.0–36.0)
MCV: 80.5 fL (ref 80.0–100.0)
Platelets: 190 K/uL (ref 150–400)
RBC: 5.74 MIL/uL (ref 4.22–5.81)
RDW: 17.1 % — ABNORMAL HIGH (ref 11.5–15.5)
WBC: 6.4 K/uL (ref 4.0–10.5)
nRBC: 0 % (ref 0.0–0.2)

## 2024-10-06 LAB — ECHOCARDIOGRAM COMPLETE
AR max vel: 2 cm2
AV Peak grad: 3.3 mmHg
Ao pk vel: 0.91 m/s
Calc EF: 26.2 %
Height: 71 in
S' Lateral: 5.4 cm
Single Plane A2C EF: 24.5 %
Single Plane A4C EF: 28.2 %
Weight: 4469.17 [oz_av]

## 2024-10-06 LAB — COMPREHENSIVE METABOLIC PANEL WITH GFR
ALT: 40 U/L (ref 0–44)
AST: 28 U/L (ref 15–41)
Albumin: 3.2 g/dL — ABNORMAL LOW (ref 3.5–5.0)
Alkaline Phosphatase: 47 U/L (ref 38–126)
Anion gap: 11 (ref 5–15)
BUN: 10 mg/dL (ref 6–20)
CO2: 31 mmol/L (ref 22–32)
Calcium: 8.8 mg/dL — ABNORMAL LOW (ref 8.9–10.3)
Chloride: 96 mmol/L — ABNORMAL LOW (ref 98–111)
Creatinine, Ser: 0.99 mg/dL (ref 0.61–1.24)
GFR, Estimated: >60 mL/min (ref >60)
Glucose, Bld: 160 mg/dL — ABNORMAL HIGH (ref 70–99)
Potassium: 4.1 mmol/L (ref 3.5–5.1)
Sodium: 138 mmol/L (ref 135–145)
Total Bilirubin: 0.4 mg/dL (ref 0.0–1.2)
Total Protein: 6.5 g/dL (ref 6.5–8.1)

## 2024-10-06 LAB — GLUCOSE, CAPILLARY
Glucose-Capillary: 209 mg/dL — ABNORMAL HIGH (ref 70–99)
Glucose-Capillary: 183 mg/dL — ABNORMAL HIGH (ref 70–99)
Glucose-Capillary: 167 mg/dL — ABNORMAL HIGH (ref 70–99)
Glucose-Capillary: 171 mg/dL — ABNORMAL HIGH (ref 70–99)
Glucose-Capillary: 194 mg/dL — ABNORMAL HIGH (ref 70–99)

## 2024-10-06 LAB — MAGNESIUM: Magnesium: 1.9 mg/dL (ref 1.7–2.4)

## 2024-10-06 MED ORDER — HEPARIN BOLUS VIA INFUSION
5000.0000 [IU] | Freq: Once | INTRAVENOUS | Status: AC
  Administered 2024-10-06: 5000 [IU] via INTRAVENOUS
  Filled 2024-10-06: qty 5000

## 2024-10-06 MED ORDER — PERFLUTREN LIPID MICROSPHERE
1.0000 mL | INTRAVENOUS | Status: AC | PRN
  Administered 2024-10-06: 4 mL via INTRAVENOUS

## 2024-10-06 MED ORDER — SPIRONOLACTONE 12.5 MG HALF TABLET
12.5000 mg | ORAL_TABLET | Freq: Every day | ORAL | Status: DC
  Administered 2024-10-06 – 2024-10-08 (×3): 12.5 mg via ORAL
  Filled 2024-10-06 (×3): qty 1

## 2024-10-06 MED ORDER — SACUBITRIL-VALSARTAN 24-26 MG PO TABS
1.0000 | ORAL_TABLET | Freq: Two times a day (BID) | ORAL | Status: DC
  Administered 2024-10-06 – 2024-10-08 (×4): 1 via ORAL
  Filled 2024-10-06 (×4): qty 1

## 2024-10-06 MED ORDER — ORAL CARE MOUTH RINSE: 15.0000 mL | OROMUCOSAL | Status: DC | PRN

## 2024-10-06 MED ORDER — INSULIN ASPART 100 UNIT/ML IJ SOLN
4.0000 [IU] | Freq: Three times a day (TID) | INTRAMUSCULAR | Status: DC
  Administered 2024-10-06 – 2024-10-08 (×4): 4 [IU] via SUBCUTANEOUS
  Filled 2024-10-06 (×4): qty 4

## 2024-10-06 MED ORDER — FUROSEMIDE 10 MG/ML IJ SOLN
20.0000 mg | Freq: Once | INTRAMUSCULAR | Status: AC
  Administered 2024-10-06: 20 mg via INTRAVENOUS
  Filled 2024-10-06: qty 2

## 2024-10-06 MED ORDER — HEPARIN (PORCINE) 25000 UT/250ML-% IV SOLN
1500.0000 [IU]/h | INTRAVENOUS | Status: DC
  Administered 2024-10-06 – 2024-10-07 (×2): 1500 [IU]/h via INTRAVENOUS
  Filled 2024-10-06 (×2): qty 250

## 2024-10-06 NOTE — Plan of Care (Signed)
°  Problem: Nutrition: Goal: Adequate nutrition will be maintained Outcome: Progressing   Problem: Coping: Goal: Level of anxiety will decrease Outcome: Not Progressing   Problem: Nutrition: Goal: Adequate nutrition will be maintained Outcome: Progressing

## 2024-10-06 NOTE — Progress Notes (Signed)
 Patient called out stating he was having chest pain and numbness/tingling down his right arm. MD made aware and EKG placed on chart. MD believes that it may not be cardiac related and instead some form of GERD. Patient was informed to let us  know if anything happened again similar to this episode and we would keep a watch on his EKG strip. MD stated to reach out if anything occurred again.

## 2024-10-06 NOTE — Progress Notes (Signed)
 Echocardiogram 2D Echocardiogram has been performed with echo imagining agent (Definity ).  Raj Landress N Liz Pinho,RDCS 10/06/2024, 11:30 AM
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
## 2024-10-06 NOTE — H&P (View-Only) (Signed)
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 anymore. Currently on Lovenox  for VTE prophylaxis. Will switch to IV Heparin  for treatment of atrial fibrillation in anticipation of R/ LHC. Can start Eliquis  after procedures.   Hypertension BP well controlled.  - Home medications: Amlodipine  10mg  daily, Bisoprolol  10mg  daily, Lisinopril -HCTZ 20-12.5mg  daily. Will hold all these and focus on GDMT for CHF as above.  Hyperlipidemia Lipid panel this admission: Total Cholesterol 182, Triglycerides 90, HDL 50, LDL 114.  - Recently prescribed Pravastatin but patient has not picked this up yet. If cath shows any CAD, would recommend high-intensity statin instead.   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 Cardiology will follow with you.  Vinie KYM Maxcy, MD, [facility], FNLA, FACP    [facility]  Medical Director of the [facility]  [facility] Diplomate of the American Board of Clinical Lipidology Attending Cardiologist  Direct Dial: [phone]  Fax: [phone]  Website:  [email or web address]  Vinie JAYSON Maxcy, MD  10/06/2024 3:22 PM      [1] No Known Allergies

## 2024-10-06 NOTE — Plan of Care (Signed)

## 2024-10-06 NOTE — Consult Note (Addendum)
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 anymore. Currently on Lovenox  for VTE prophylaxis. Will switch to IV Heparin  for treatment of atrial fibrillation in anticipation of R/ LHC. Can start Eliquis  after procedures.   Hypertension BP well controlled.  - Home medications: Amlodipine  10mg  daily, Bisoprolol  10mg  daily, Lisinopril -HCTZ 20-12.5mg  daily. Will hold all these and focus on GDMT for CHF as above.  Hyperlipidemia Lipid panel this admission: Total Cholesterol 182, Triglycerides 90, HDL 50, LDL 114.  - Recently prescribed Pravastatin but patient has not picked this up yet. If cath shows any CAD, would recommend high-intensity statin instead.   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 Cardiology will follow with you.  Vinie KYM Maxcy, MD, [facility], FNLA, FACP    [facility]  Medical Director of the [facility]  [facility] Diplomate of the American Board of Clinical Lipidology Attending Cardiologist  Direct Dial: [phone]  Fax: [phone]  Website:  [email or web address]  Vinie JAYSON Maxcy, MD  10/06/2024 3:22 PM      [1] No Known Allergies
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
## 2024-10-07 ENCOUNTER — Other Ambulatory Visit (HOSPITAL_COMMUNITY): Payer: Self-pay

## 2024-10-07 ENCOUNTER — Encounter (HOSPITAL_COMMUNITY)
Admission: EM | Disposition: A | Discharge status: Home / Self Care | Payer: Self-pay | Source: Home / Self Care | Attending: Internal Medicine

## 2024-10-07 ENCOUNTER — Telehealth (HOSPITAL_COMMUNITY): Payer: Self-pay

## 2024-10-07 ENCOUNTER — Encounter (HOSPITAL_COMMUNITY): Payer: Self-pay | Admitting: Internal Medicine

## 2024-10-07 DIAGNOSIS — I5023 Acute on chronic systolic (congestive) heart failure: Secondary | ICD-10-CM

## 2024-10-07 HISTORY — PX: RIGHT/LEFT HEART CATH AND CORONARY ANGIOGRAPHY: CATH118266

## 2024-10-07 LAB — BASIC METABOLIC PANEL WITH GFR
Anion gap: 13 (ref 5–15)
Anion gap: 10 (ref 5–15)
BUN: 11 mg/dL (ref 6–20)
BUN: 15 mg/dL (ref 6–20)
CO2: 25 mmol/L (ref 22–32)
CO2: 28 mmol/L (ref 22–32)
Calcium: 8.9 mg/dL (ref 8.9–10.3)
Calcium: 8.6 mg/dL — ABNORMAL LOW (ref 8.9–10.3)
Chloride: 101 mmol/L (ref 98–111)
Chloride: 98 mmol/L (ref 98–111)
Creatinine, Ser: 0.94 mg/dL (ref 0.61–1.24)
Creatinine, Ser: 1.03 mg/dL (ref 0.61–1.24)
GFR, Estimated: >60 mL/min (ref >60)
GFR, Estimated: >60 mL/min (ref >60)
Glucose, Bld: 76 mg/dL (ref 70–99)
Glucose, Bld: 274 mg/dL — ABNORMAL HIGH (ref 70–99)
Potassium: 3.7 mmol/L (ref 3.5–5.1)
Potassium: 4.1 mmol/L (ref 3.5–5.1)
Sodium: 139 mmol/L (ref 135–145)
Sodium: 136 mmol/L (ref 135–145)

## 2024-10-07 LAB — CBC
HCT: 48.5 % (ref 39.0–52.0)
Hemoglobin: 15.8 g/dL (ref 13.0–17.0)
MCH: 25.6 pg — ABNORMAL LOW (ref 26.0–34.0)
MCHC: 32.6 g/dL (ref 30.0–36.0)
MCV: 78.5 fL — ABNORMAL LOW (ref 80.0–100.0)
Platelets: 197 K/uL (ref 150–400)
RBC: 6.18 MIL/uL — ABNORMAL HIGH (ref 4.22–5.81)
RDW: 17.5 % — ABNORMAL HIGH (ref 11.5–15.5)
WBC: 7.1 K/uL (ref 4.0–10.5)
nRBC: 0 % (ref 0.0–0.2)

## 2024-10-07 LAB — POCT I-STAT 7, (LYTES, BLD GAS, ICA,H+H)
Acid-Base Excess: 3 mmol/L — ABNORMAL HIGH (ref 0.0–2.0)
Bicarbonate: 27.6 mmol/L (ref 20.0–28.0)
Calcium, Ion: 1.15 mmol/L (ref 1.15–1.40)
HCT: 49 % (ref 39.0–52.0)
Hemoglobin: 16.7 g/dL (ref 13.0–17.0)
O2 Saturation: 97 %
Potassium: 4 mmol/L (ref 3.5–5.1)
Sodium: 137 mmol/L (ref 135–145)
TCO2: 29 mmol/L (ref 22–32)
pCO2 arterial: 42.9 mmHg (ref 32–48)
pH, Arterial: 7.417 (ref 7.35–7.45)
pO2, Arterial: 90 mmHg (ref 83–108)

## 2024-10-07 LAB — GLUCOSE, CAPILLARY
Glucose-Capillary: 119 mg/dL — ABNORMAL HIGH (ref 70–99)
Glucose-Capillary: 162 mg/dL — ABNORMAL HIGH (ref 70–99)
Glucose-Capillary: 280 mg/dL — ABNORMAL HIGH (ref 70–99)
Glucose-Capillary: 248 mg/dL — ABNORMAL HIGH (ref 70–99)

## 2024-10-07 LAB — POCT I-STAT EG7
Acid-base deficit: 13 mmol/L — ABNORMAL HIGH (ref 0.0–2.0)
Acid-base deficit: 2 mmol/L (ref 0.0–2.0)
Bicarbonate: 14.5 mmol/L — ABNORMAL LOW (ref 20.0–28.0)
Bicarbonate: 24.6 mmol/L (ref 20.0–28.0)
Calcium, Ion: 0.39 mmol/L — CL (ref 1.15–1.40)
Calcium, Ion: 0.84 mmol/L — CL (ref 1.15–1.40)
HCT: 39 % (ref 39.0–52.0)
HCT: 43 % (ref 39.0–52.0)
Hemoglobin: 13.3 g/dL (ref 13.0–17.0)
Hemoglobin: 14.6 g/dL (ref 13.0–17.0)
O2 Saturation: 68 %
O2 Saturation: 75 %
Potassium: 3 mmol/L — ABNORMAL LOW (ref 3.5–5.1)
Potassium: <2 mmol/L — CL (ref 3.5–5.1)
Sodium: 117 mmol/L — CL (ref 135–145)
Sodium: 132 mmol/L — ABNORMAL LOW (ref 135–145)
TCO2: 16 mmol/L — ABNORMAL LOW (ref 22–32)
TCO2: 26 mmol/L (ref 22–32)
pCO2, Ven: 38.3 mmHg — ABNORMAL LOW (ref 44–60)
pCO2, Ven: 48 mmHg (ref 44–60)
pH, Ven: 7.185 — CL (ref 7.25–7.43)
pH, Ven: 7.318 (ref 7.25–7.43)
pO2, Ven: 43 mmHg (ref 32–45)
pO2, Ven: 43 mmHg (ref 32–45)

## 2024-10-07 LAB — HEPARIN LEVEL (UNFRACTIONATED)
Heparin Unfractionated: 0.5 [IU]/mL (ref 0.30–0.70)
Heparin Unfractionated: 0.61 [IU]/mL (ref 0.30–0.70)

## 2024-10-07 LAB — MAGNESIUM: Magnesium: 2 mg/dL (ref 1.7–2.4)

## 2024-10-07 SURGERY — RIGHT/LEFT HEART CATH AND CORONARY ANGIOGRAPHY
Anesthesia: LOCAL

## 2024-10-07 MED ORDER — ASPIRIN 81 MG PO CHEW
81.0000 mg | CHEWABLE_TABLET | ORAL | Status: AC
  Administered 2024-10-07: 15:00:00 81 mg via ORAL
  Filled 2024-10-07: qty 1

## 2024-10-07 MED ORDER — POTASSIUM CHLORIDE 20 MEQ PO PACK
40.0000 meq | PACK | Freq: Once | ORAL | Status: AC
  Administered 2024-10-07: 08:00:00 40 meq via ORAL
  Filled 2024-10-07: qty 2

## 2024-10-07 MED ORDER — APIXABAN 5 MG PO TABS
5.0000 mg | ORAL_TABLET | Freq: Two times a day (BID) | ORAL | Status: DC
  Administered 2024-10-07 – 2024-10-08 (×2): 5 mg via ORAL
  Filled 2024-10-07 (×2): qty 1

## 2024-10-07 MED ORDER — FREE WATER
250.0000 mL | Freq: Once | Status: AC
  Administered 2024-10-07: 13:00:00 250 mL via ORAL

## 2024-10-07 MED ORDER — POLYETHYLENE GLYCOL 3350 17 G PO PACK
34.0000 g | PACK | Freq: Every day | ORAL | Status: DC | PRN
  Administered 2024-10-08: 11:00:00 34 g via ORAL
  Filled 2024-10-07: qty 2

## 2024-10-07 MED ORDER — VERAPAMIL HCL 2.5 MG/ML IV SOLN
INTRAVENOUS | Status: DC | PRN
  Administered 2024-10-07: 17:00:00 10 mL via INTRA_ARTERIAL

## 2024-10-07 MED ORDER — SODIUM CHLORIDE 0.9% FLUSH
3.0000 mL | Freq: Two times a day (BID) | INTRAVENOUS | Status: DC
  Administered 2024-10-07 – 2024-10-08 (×2): 3 mL via INTRAVENOUS

## 2024-10-07 MED ORDER — MIDAZOLAM HCL (PF) 2 MG/2ML IJ SOLN
INTRAMUSCULAR | Status: DC | PRN
  Administered 2024-10-07 (×2): 1 mg via INTRAVENOUS

## 2024-10-07 MED ORDER — LABETALOL HCL 5 MG/ML IV SOLN: 10.0000 mg | INTRAVENOUS | Status: AC | PRN

## 2024-10-07 MED ORDER — HEPARIN (PORCINE) IN NACL 1000-0.9 UT/500ML-% IV SOLN
INTRAVENOUS | Status: DC | PRN
  Administered 2024-10-07: 17:00:00 1000 mL

## 2024-10-07 MED ORDER — SENNOSIDES-DOCUSATE SODIUM 8.6-50 MG PO TABS
1.0000 | ORAL_TABLET | Freq: Every day | ORAL | Status: DC
  Administered 2024-10-07: 23:00:00 1 via ORAL
  Filled 2024-10-07: qty 1

## 2024-10-07 MED ORDER — HYDRALAZINE HCL 20 MG/ML IJ SOLN: 10.0000 mg | INTRAMUSCULAR | Status: AC | PRN

## 2024-10-07 MED ORDER — IOHEXOL 350 MG/ML SOLN
INTRAVENOUS | Status: DC | PRN
  Administered 2024-10-07: 17:00:00 40 mL

## 2024-10-07 MED ORDER — LIDOCAINE HCL (PF) 1 % IJ SOLN
INTRAMUSCULAR | Status: AC
  Filled 2024-10-07: qty 30

## 2024-10-07 MED ORDER — VERAPAMIL HCL 2.5 MG/ML IV SOLN
INTRAVENOUS | Status: AC
  Filled 2024-10-07: qty 2

## 2024-10-07 MED ORDER — FUROSEMIDE 10 MG/ML IJ SOLN
20.0000 mg | Freq: Once | INTRAMUSCULAR | Status: AC
  Administered 2024-10-07: 09:00:00 20 mg via INTRAVENOUS
  Filled 2024-10-07: qty 2

## 2024-10-07 MED ORDER — FENTANYL CITRATE (PF) 100 MCG/2ML IJ SOLN
INTRAMUSCULAR | Status: DC | PRN
  Administered 2024-10-07 (×2): 25 ug via INTRAVENOUS

## 2024-10-07 MED ORDER — MIDAZOLAM HCL 2 MG/2ML IJ SOLN
INTRAMUSCULAR | Status: AC
  Filled 2024-10-07: qty 2

## 2024-10-07 MED ORDER — ONDANSETRON HCL 4 MG/2ML IJ SOLN: 4.0000 mg | Freq: Four times a day (QID) | INTRAMUSCULAR | Status: DC | PRN

## 2024-10-07 MED ORDER — INSULIN GLARGINE 100 UNIT/ML ~~LOC~~ SOLN
18.0000 [IU] | Freq: Every day | SUBCUTANEOUS | Status: DC
  Administered 2024-10-07: 23:00:00 18 [IU] via SUBCUTANEOUS
  Filled 2024-10-07 (×2): qty 0.18

## 2024-10-07 MED ORDER — HEPARIN SODIUM (PORCINE) 1000 UNIT/ML IJ SOLN
INTRAMUSCULAR | Status: AC
  Filled 2024-10-07: qty 10

## 2024-10-07 MED ORDER — ROSUVASTATIN CALCIUM 20 MG PO TABS
20.0000 mg | ORAL_TABLET | Freq: Every day | ORAL | Status: DC
  Administered 2024-10-07 – 2024-10-08 (×2): 20 mg via ORAL
  Filled 2024-10-07 (×2): qty 1

## 2024-10-07 MED ORDER — ACETAMINOPHEN 325 MG PO TABS: 650.0000 mg | ORAL_TABLET | ORAL | Status: DC | PRN

## 2024-10-07 MED ORDER — LIDOCAINE HCL (PF) 1 % IJ SOLN
INTRAMUSCULAR | Status: DC | PRN
  Administered 2024-10-07: 17:00:00 5 mL via INTRADERMAL

## 2024-10-07 MED ORDER — SODIUM CHLORIDE 0.9 % IV SOLN: 250.0000 mL | INTRAVENOUS | Status: DC | PRN

## 2024-10-07 MED ORDER — FREE WATER
250.0000 mL | Freq: Once | Status: AC
  Administered 2024-10-07: 20:00:00 250 mL via ORAL

## 2024-10-07 MED ORDER — FENTANYL CITRATE (PF) 100 MCG/2ML IJ SOLN
INTRAMUSCULAR | Status: AC
  Filled 2024-10-07: qty 2

## 2024-10-07 MED ORDER — HEPARIN SODIUM (PORCINE) 1000 UNIT/ML IJ SOLN
INTRAMUSCULAR | Status: DC | PRN
  Administered 2024-10-07: 17:00:00 5000 [IU] via INTRA_ARTERIAL

## 2024-10-07 MED ORDER — SODIUM CHLORIDE 0.9% FLUSH: 3.0000 mL | INTRAVENOUS | Status: DC | PRN

## 2024-10-07 SURGICAL SUPPLY — 12 items
CATH BALLN WEDGE 5F 110CM (CATHETERS) IMPLANT
CATH INFINITI AMBI 6FR TG (CATHETERS) IMPLANT
DEVICE RAD COMP TR BAND LRG (VASCULAR PRODUCTS) IMPLANT
GLIDESHEATH SLEND SS 6F .021 (SHEATH) IMPLANT
KIT NAMIC PS PRESSURIZED FLUID (KITS) IMPLANT
KIT SINGLE USE MANIFOLD (KITS) IMPLANT
PACK CARDIAC CATHETERIZATION (CUSTOM PROCEDURE TRAY) ×1 IMPLANT
SET ATX-X65L (MISCELLANEOUS) IMPLANT
SHEATH GLIDE SLENDER 4/5FR (SHEATH) IMPLANT
SHEATH PROBE COVER 6X72 (BAG) IMPLANT
WIRE EMERALD 3MM-J .035X260CM (WIRE) IMPLANT
WIRE MICRO SET SILHO 5FR 7 (SHEATH) IMPLANT
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
## 2024-10-07 NOTE — Plan of Care (Signed)

## 2024-10-07 NOTE — Interval H&P Note (Signed)
 History and Physical Interval Note:  10/07/2024 3:13 PM  James Terry  has presented today for surgery, with the diagnosis of heart failure.  The various methods of treatment have been discussed with the patient and family. After consideration of risks, benefits and other options for treatment, the patient has consented to  **Not Scheduled** Procedures: RIGHT/LEFT HEART CATH AND CORONARY ANGIOGRAPHY (N/A) as a surgical intervention.  The patient's history has been reviewed, patient examined, no change in status, stable for surgery.  I have reviewed the patient's chart and labs.  Questions were answered to the patient's satisfaction.     Terianna Peggs K Indigo Chaddock

## 2024-10-07 NOTE — Progress Notes (Signed)
 PHARMACY - ANTICOAGULATION  Pharmacy Consult for heparin  Indication: atrial fibrillation Brief A/P: Heparin  level within goal range Continue Heparin  at current rate   Allergies[1]  Patient Measurements: Height: 5' 11 (180.3 cm) Weight: 127.5 kg (281 lb 1.6 oz) IBW/kg (Calculated) : 75.3 HEPARIN  DW (KG): 103.9  Vital Signs: Temp: 98.9 F (37.2 C) (12/14 2334) Temp Source: Oral (12/14 2334) BP: 145/93 (12/14 2334) Pulse Rate: 107 (12/14 2334)  Labs: Recent Labs    10/05/24 0845 10/05/24 1035 10/05/24 1640 10/05/24 1955 10/06/24 0514 10/06/24 2340  HGB 14.2  --   --   --  14.8  --   HCT 44.2  --   --   --  46.2  --   PLT 193  --   --   --  190  --   HEPARINUNFRC  --   --   --   --   --  0.61  CREATININE 0.94  --  0.99  --  0.99  --   TROPONINIHS 25* 51* 68* 40*  --   --     Estimated Creatinine Clearance: 114.7 mL/min (by C-G formula based on SCr of 0.99 mg/dL).  Assessment: 53 y.o. male with h/o Afib for heparin   Goal of Therapy:  Heparin  level 0.3-0.7 units/ml Monitor platelets by anticoagulation protocol: Yes   Plan:  No change to heparin  Follow-up am labs.  Cathlyn Arrant, PharmD, BCPS 10/07/2024 12:11 AM      [1] No Known Allergies
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
## 2024-10-07 NOTE — Progress Notes (Signed)
°   10/07/24 1558  Spiritual Encounters  Type of Visit Initial  Care provided to: Patient;Family;Pt and family  Referral source Patient request  Reason for visit Urgent spiritual support  OnCall Visit No   Chaplain offered consultation and prayer for the patient and wife
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
## 2024-10-07 NOTE — Telephone Encounter (Signed)
 Pharmacy Patient Advocate Encounter  Insurance verification completed.    The patient is insured through [employer]. Patient has Medicare and is not eligible for a copay card, but may be able to apply for patient assistance or Medicare RX Payment Plan (Patient Must reach out to their plan, if eligible for payment plan), if available.    Ran test claim for Generic Entresto  24-26mg  and the current 30 day co-pay is $0.   This test claim was processed through [facility]- copay amounts may vary at other pharmacies due to [employer], or as the patient moves through the different stages of their insurance plan.

## 2024-10-07 NOTE — Progress Notes (Signed)
 Dr alerted about K levels, stat BMP ordered to verify as k levels was a venous draw. RN asked Dr about nighttime insulin , provider stated to hold until BMP results
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
## 2024-10-07 NOTE — Telephone Encounter (Signed)
 Patient Advocate Encounter  Test billing for this patient's current coverage ([employer]) returns a $0 copay for 90 day supply of Farxiga, Jardiance , Sacubitril -Valsartan .  This test claim was processed through [facility]- copay amounts may vary at other pharmacies due to pharmacy/plan contracts, or as the patient moves through the different stages of their insurance plan.  Rachel DEL, CPhT Rx Patient Advocate Phone: [phone]

## 2024-10-08 ENCOUNTER — Other Ambulatory Visit: Payer: Self-pay | Admitting: Student

## 2024-10-08 ENCOUNTER — Other Ambulatory Visit (HOSPITAL_COMMUNITY): Payer: Self-pay

## 2024-10-08 ENCOUNTER — Encounter (HOSPITAL_COMMUNITY): Payer: Self-pay | Admitting: Internal Medicine

## 2024-10-08 DIAGNOSIS — I5021 Acute systolic (congestive) heart failure: Secondary | ICD-10-CM

## 2024-10-08 LAB — MAGNESIUM: Magnesium: 1.8 mg/dL (ref 1.7–2.4)

## 2024-10-08 LAB — BASIC METABOLIC PANEL WITH GFR
Anion gap: 10 (ref 5–15)
BUN: 12 mg/dL (ref 6–20)
CO2: 26 mmol/L (ref 22–32)
Calcium: 8.7 mg/dL — ABNORMAL LOW (ref 8.9–10.3)
Chloride: 101 mmol/L (ref 98–111)
Creatinine, Ser: 0.98 mg/dL (ref 0.61–1.24)
GFR, Estimated: >60 mL/min (ref >60)
Glucose, Bld: 156 mg/dL — ABNORMAL HIGH (ref 70–99)
Potassium: 3.9 mmol/L (ref 3.5–5.1)
Sodium: 137 mmol/L (ref 135–145)

## 2024-10-08 LAB — GLUCOSE, CAPILLARY
Glucose-Capillary: 215 mg/dL — ABNORMAL HIGH (ref 70–99)
Glucose-Capillary: 145 mg/dL — ABNORMAL HIGH (ref 70–99)

## 2024-10-08 LAB — CBC
HCT: 45.1 % (ref 39.0–52.0)
Hemoglobin: 14.4 g/dL (ref 13.0–17.0)
MCH: 25.2 pg — ABNORMAL LOW (ref 26.0–34.0)
MCHC: 31.9 g/dL (ref 30.0–36.0)
MCV: 78.8 fL — ABNORMAL LOW (ref 80.0–100.0)
Platelets: 194 K/uL (ref 150–400)
RBC: 5.72 MIL/uL (ref 4.22–5.81)
RDW: 16.8 % — ABNORMAL HIGH (ref 11.5–15.5)
WBC: 5.9 K/uL (ref 4.0–10.5)
nRBC: 0 % (ref 0.0–0.2)

## 2024-10-08 MED ORDER — SPIRONOLACTONE 25 MG PO TABS
12.5000 mg | ORAL_TABLET | Freq: Every day | ORAL | 0 refills | Status: DC
  Filled 2024-10-08: qty 15, 30d supply, fill #0

## 2024-10-08 MED ORDER — SACUBITRIL-VALSARTAN 24-26 MG PO TABS
1.0000 | ORAL_TABLET | Freq: Two times a day (BID) | ORAL | 0 refills | Status: DC
  Filled 2024-10-08: qty 60, 30d supply, fill #0

## 2024-10-08 MED ORDER — APIXABAN 5 MG PO TABS
5.0000 mg | ORAL_TABLET | Freq: Two times a day (BID) | ORAL | 0 refills | Status: DC
  Filled 2024-10-08: qty 60, 30d supply, fill #0

## 2024-10-08 MED ORDER — FUROSEMIDE 40 MG PO TABS: 40.0000 mg | ORAL_TABLET | Freq: Every day | ORAL | Status: DC | PRN

## 2024-10-08 MED ORDER — POTASSIUM CHLORIDE 20 MEQ PO PACK
20.0000 meq | PACK | Freq: Once | ORAL | Status: AC
  Administered 2024-10-08: 09:00:00 20 meq via ORAL
  Filled 2024-10-08: qty 1

## 2024-10-08 MED ORDER — FUROSEMIDE 40 MG PO TABS
40.0000 mg | ORAL_TABLET | Freq: Every day | ORAL | 0 refills | Status: DC | PRN
  Filled 2024-10-08: qty 30, 30d supply, fill #0

## 2024-10-08 MED ORDER — POLYETHYLENE GLYCOL 3350 17 GM/SCOOP PO POWD
34.0000 g | Freq: Every day | ORAL | 0 refills | Status: AC | PRN
  Filled 2024-10-08: qty 238, 7d supply, fill #0

## 2024-10-08 MED ORDER — ROSUVASTATIN CALCIUM 20 MG PO TABS
20.0000 mg | ORAL_TABLET | Freq: Every day | ORAL | 0 refills | Status: DC
  Filled 2024-10-08: qty 30, 30d supply, fill #0

## 2024-10-08 MED ORDER — CARVEDILOL 12.5 MG PO TABS
12.5000 mg | ORAL_TABLET | Freq: Two times a day (BID) | ORAL | Status: DC
  Administered 2024-10-08: 09:00:00 12.5 mg via ORAL
  Filled 2024-10-08: qty 1

## 2024-10-08 MED ORDER — SENNOSIDES-DOCUSATE SODIUM 8.6-50 MG PO TABS
1.0000 | ORAL_TABLET | Freq: Every day | ORAL | 0 refills | Status: AC
  Filled 2024-10-08: qty 30, 30d supply, fill #0

## 2024-10-08 MED ORDER — EMPAGLIFLOZIN 10 MG PO TABS
10.0000 mg | ORAL_TABLET | Freq: Every day | ORAL | 0 refills | Status: DC
  Filled 2024-10-08: qty 30, 30d supply, fill #0

## 2024-10-08 MED ORDER — EMPAGLIFLOZIN 10 MG PO TABS
10.0000 mg | ORAL_TABLET | Freq: Every day | ORAL | Status: DC
  Administered 2024-10-08: 09:00:00 10 mg via ORAL
  Filled 2024-10-08: qty 1

## 2024-10-08 MED ORDER — MAGNESIUM SULFATE 2 GM/50ML IV SOLN
2.0000 g | Freq: Once | INTRAVENOUS | Status: AC
  Administered 2024-10-08: 11:00:00 2 g via INTRAVENOUS
  Filled 2024-10-08: qty 50

## 2024-10-08 MED ORDER — CARVEDILOL 12.5 MG PO TABS
12.5000 mg | ORAL_TABLET | Freq: Two times a day (BID) | ORAL | 0 refills | Status: DC
  Filled 2024-10-08: qty 60, 30d supply, fill #0

## 2024-10-08 NOTE — Plan of Care (Signed)

## 2024-10-08 NOTE — Discharge Instructions (Addendum)
 You were hospitalized because you were having trouble breathing and we found that this was due to your heart not pumping efficiently. We have started you on some medications to help your heart pump more efficiently and helped get some fluid off of you with lasix .  Thank you for allowing us  to be part of your care.   Please arrange hospital follow-up with:  With your primary care doctor in one week, they will get some lab work to check your kidney function and electrolytes  And please go to your appt with your 11/07/2024   Please note these changes made to your medications:  *Please START taking:  Apixaban  5 mg twice a day Carvedilol  12.5 mg twice a day Jardiance  10 mg once a day Rosuvastatin  (Crestor ) 20 mg Entresto  24-26 mg twice a day Spironolactone  12.5 mg ( half a tablet)  For your Lasix , please weigh yourself daily and if you gain about 3 pounds in 24 hours or about 5 to 10 pounds in 1 week and are having worsening edema in your legs, shortness of breath take 40 mg of lasix     *Please STOP taking:  - Amlodipine  10 mg Bisoprolol  10 mg Lisinopril  hydrochlorothiazide  Magnesium  Pravastatin  *Please CONTINUE taking:  -  Please call our clinic if you have any questions or concerns, we may be able to help and keep you from a long and expensive emergency room wait. Our clinic and after hours phone number is [phone], the best time to call is Monday through Friday 9 am to 4 pm but there is always someone available 24/7 if you have an emergency. If you need medication refills please notify your pharmacy one week in advance and they will send us  a request.      Radial Site Care Refer to this sheet in the next few weeks. These instructions provide you with information on caring for yourself after your procedure. Your caregiver may also give you more specific instructions. Your treatment has been planned according to current medical practices, but problems sometimes occur. Call your  caregiver if you have any problems or questions after your procedure. HOME CARE INSTRUCTIONS You may shower the day after the procedure. Remove the bandage (dressing) and gently wash the site with plain soap and water . Gently pat the site dry.  Do not apply powder or lotion to the site.  Do not submerge the affected site in water  for 3 to 5 days.  Inspect the site at least twice daily.  Do not flex or bend the affected arm for 24 hours.  No lifting over 5 pounds (2.3 kg) for 5 days after your procedure.  Do not drive home if you are discharged the same day of the procedure. Have someone else drive you.  You may drive 24 hours after the procedure unless otherwise instructed by your caregiver.  What to expect: Any bruising will usually fade within 1 to 2 weeks.  Blood that collects in the tissue (hematoma) may be painful to the touch. It should usually decrease in size and tenderness within 1 to 2 weeks.  SEEK IMMEDIATE MEDICAL CARE IF: You have unusual pain at the radial site.  You have redness, warmth, swelling, or pain at the radial site.  You have drainage (other than a small amount of blood on the dressing).  You have chills.  You have a fever or persistent symptoms for more than 72 hours.  You have a fever and your symptoms suddenly get worse.  Your arm  becomes pale, cool, tingly, or numb.  You have heavy bleeding from the site. Hold pressure on the site.

## 2024-10-08 NOTE — Care Management Important Message (Signed)
 Important Message  Patient Details  Name: James Terry MRN: [id number] Date of Birth: Jan 20, 1971   Important Message Given:  Yes - Medicare IM     Vonzell Arrie Sharps 10/08/2024, 10:59 AM

## 2024-10-08 NOTE — Plan of Care (Signed)
 Problem: Education: Goal: Knowledge of General Education information will improve Description: Including pain rating scale, medication(s)/side effects and non-pharmacologic comfort measures 10/08/2024 1243 by Marylu Randine NOVAK, RN Outcome: Progressing 10/08/2024 1141 by Marylu Randine NOVAK, RN Outcome: Progressing   Problem: Health Behavior/Discharge Planning: Goal: Ability to manage health-related needs will improve 10/08/2024 1243 by Marylu Randine NOVAK, RN Outcome: Progressing 10/08/2024 1141 by Marylu Randine NOVAK, RN Outcome: Progressing   Problem: Clinical Measurements: Goal: Ability to maintain clinical measurements within normal limits will improve 10/08/2024 1243 by Marylu Randine NOVAK, RN Outcome: Progressing 10/08/2024 1141 by Marylu Randine NOVAK, RN Outcome: Progressing Goal: Will remain free from infection 10/08/2024 1243 by Marylu Randine NOVAK, RN Outcome: Progressing 10/08/2024 1141 by Marylu Randine NOVAK, RN Outcome: Progressing Goal: Diagnostic test results will improve 10/08/2024 1243 by Marylu Randine NOVAK, RN Outcome: Progressing 10/08/2024 1141 by Marylu Randine NOVAK, RN Outcome: Progressing Goal: Respiratory complications will improve 10/08/2024 1243 by Marylu Randine NOVAK, RN Outcome: Progressing 10/08/2024 1141 by Marylu Randine NOVAK, RN Outcome: Progressing Goal: Cardiovascular complication will be avoided 10/08/2024 1243 by Marylu Randine NOVAK, RN Outcome: Progressing 10/08/2024 1141 by Marylu Randine NOVAK, RN Outcome: Progressing   Problem: Activity: Goal: Risk for activity intolerance will decrease 10/08/2024 1243 by Marylu Randine NOVAK, RN Outcome: Progressing 10/08/2024 1141 by Marylu Randine NOVAK, RN Outcome: Progressing   Problem: Nutrition: Goal: Adequate nutrition will be maintained 10/08/2024 1243 by Marylu Randine NOVAK, RN Outcome: Progressing 10/08/2024 1141 by Marylu Randine NOVAK, RN Outcome: Progressing   Problem: Coping: Goal: Level of anxiety will decrease 10/08/2024 1243 by Marylu Randine NOVAK, RN Outcome: Progressing 10/08/2024 1141 by Marylu Randine NOVAK, RN Outcome: Progressing   Problem: Elimination: Goal: Will not experience complications related to bowel motility 10/08/2024 1243 by Marylu Randine NOVAK, RN Outcome: Progressing 10/08/2024 1141 by Marylu Randine NOVAK, RN Outcome: Progressing Goal: Will not experience complications related to urinary retention 10/08/2024 1243 by Marylu Randine NOVAK, RN Outcome: Progressing 10/08/2024 1141 by Marylu Randine NOVAK, RN Outcome: Progressing   Problem: Pain Managment: Goal: General experience of comfort will improve and/or be controlled 10/08/2024 1243 by Marylu Randine NOVAK, RN Outcome: Progressing 10/08/2024 1141 by Marylu Randine NOVAK, RN Outcome: Progressing   Problem: Safety: Goal: Ability to remain free from injury will improve 10/08/2024 1243 by Marylu Randine NOVAK, RN Outcome: Progressing 10/08/2024 1141 by Marylu Randine NOVAK, RN Outcome: Progressing   Problem: Skin Integrity: Goal: Risk for impaired skin integrity will decrease 10/08/2024 1243 by Marylu Randine NOVAK, RN Outcome: Progressing 10/08/2024 1141 by Marylu Randine NOVAK, RN Outcome: Progressing   Problem: Education: Goal: Ability to describe self-care measures that may prevent or decrease complications (Diabetes Survival Skills Education) will improve 10/08/2024 1243 by Marylu Randine NOVAK, RN Outcome: Progressing 10/08/2024 1141 by Marylu Randine NOVAK, RN Outcome: Progressing Goal: Individualized Educational Video(s) 10/08/2024 1243 by Marylu Randine NOVAK, RN Outcome: Progressing 10/08/2024 1141 by Marylu Randine NOVAK, RN Outcome: Progressing   Problem: Coping: Goal: Ability to adjust to condition or change in health will improve 10/08/2024 1243 by Marylu Randine NOVAK, RN Outcome: Progressing 10/08/2024 1141 by Marylu Randine NOVAK, RN Outcome: Progressing   Problem: Fluid Volume: Goal: Ability to maintain a balanced intake and output will improve 10/08/2024 1243 by Marylu Randine NOVAK,  RN Outcome: Progressing 10/08/2024 1141 by Marylu Randine NOVAK, RN Outcome: Progressing   Problem: Health Behavior/Discharge Planning: Goal: Ability to identify and utilize available resources and services will improve 10/08/2024 1243 by Marylu Randine NOVAK, RN Outcome: Progressing 10/08/2024 1141 by  Marylu Randine NOVAK, RN Outcome: Progressing Goal: Ability to manage health-related needs will improve 10/08/2024 1243 by Marylu Randine NOVAK, RN Outcome: Progressing 10/08/2024 1141 by Marylu Randine NOVAK, RN Outcome: Progressing   Problem: Metabolic: Goal: Ability to maintain appropriate glucose levels will improve 10/08/2024 1243 by Marylu Randine NOVAK, RN Outcome: Progressing 10/08/2024 1141 by Marylu Randine NOVAK, RN Outcome: Progressing   Problem: Nutritional: Goal: Maintenance of adequate nutrition will improve 10/08/2024 1243 by Marylu Randine NOVAK, RN Outcome: Progressing 10/08/2024 1141 by Marylu Randine NOVAK, RN Outcome: Progressing Goal: Progress toward achieving an optimal weight will improve 10/08/2024 1243 by Marylu Randine NOVAK, RN Outcome: Progressing 10/08/2024 1141 by Marylu Randine NOVAK, RN Outcome: Progressing   Problem: Skin Integrity: Goal: Risk for impaired skin integrity will decrease 10/08/2024 1243 by Marylu Randine NOVAK, RN Outcome: Progressing 10/08/2024 1141 by Marylu Randine NOVAK, RN Outcome: Progressing   Problem: Tissue Perfusion: Goal: Adequacy of tissue perfusion will improve 10/08/2024 1243 by Marylu Randine NOVAK, RN Outcome: Progressing 10/08/2024 1141 by Marylu Randine NOVAK, RN Outcome: Progressing   Problem: Education: Goal: Understanding of CV disease, CV risk reduction, and recovery process will improve 10/08/2024 1243 by Marylu Randine NOVAK, RN Outcome: Progressing 10/08/2024 1141 by Marylu Randine NOVAK, RN Outcome: Progressing Goal: Individualized Educational Video(s) 10/08/2024 1243 by Marylu Randine NOVAK, RN Outcome: Progressing 10/08/2024 1141 by Marylu Randine NOVAK, RN Outcome:  Progressing   Problem: Activity: Goal: Ability to return to baseline activity level will improve 10/08/2024 1243 by Marylu Randine NOVAK, RN Outcome: Progressing 10/08/2024 1141 by Marylu Randine NOVAK, RN Outcome: Progressing   Problem: Cardiovascular: Goal: Ability to achieve and maintain adequate cardiovascular perfusion will improve 10/08/2024 1243 by Marylu Randine NOVAK, RN Outcome: Progressing 10/08/2024 1141 by Marylu Randine NOVAK, RN Outcome: Progressing Goal: Vascular access site(s) Level 0-1 will be maintained 10/08/2024 1243 by Marylu Randine NOVAK, RN Outcome: Progressing 10/08/2024 1141 by Marylu Randine NOVAK, RN Outcome: Progressing   Problem: Health Behavior/Discharge Planning: Goal: Ability to safely manage health-related needs after discharge will improve 10/08/2024 1243 by Marylu Randine NOVAK, RN Outcome: Progressing 10/08/2024 1141 by Marylu Randine NOVAK, RN Outcome: Progressing
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
## 2024-10-08 NOTE — Progress Notes (Signed)
 Outpatient basic metabolic panel in 1 week.
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
## 2024-10-09 LAB — LIPOPROTEIN A (LPA): Lipoprotein (a): 58.7 nmol/L — ABNORMAL HIGH (ref <75.0)

## 2024-10-11 ENCOUNTER — Telehealth: Payer: Self-pay | Admitting: Student in an Organized Health Care Education/Training Program

## 2024-10-11 NOTE — Telephone Encounter (Signed)
 Spoke with patient in regards to CPAP and sleep study. Cath in hospital showed no heart damage, but recommended CPAP due to him stopping breathing at night when he sleeps. Pt asking if he can get the cpap now or if he needs to see Dr. Shlomo first for sleep study. Pt would like to have cpap before the new year. Will send to Dr. Floretta for advisement. Pt verbalizes understanding of plan.

## 2024-10-11 NOTE — Telephone Encounter (Signed)
 Pt calling in to f/u on discussion during hospital. Pt states he was told by Dr. Floretta to call in about getting a new CPAP machine. Pt last sleep study was 2014. We can schedule him an appt to establish care with Dr. Shlomo for Sleep but does he need a new sleep study prior to? Please advise.

## 2024-10-14 ENCOUNTER — Telehealth: Payer: Self-pay | Admitting: Student in an Organized Health Care Education/Training Program

## 2024-10-14 NOTE — Telephone Encounter (Signed)
 Left message for patient to call back

## 2024-10-14 NOTE — Telephone Encounter (Signed)
 Patient called to follow-up with Dr. Floretta to get orders for a CPAP machine.

## 2024-10-15 NOTE — Telephone Encounter (Signed)
**Note De-Identified James Terry Obfuscation** The pt states that he has not used a CPAP machine for 10 years or more.  I advised him that since he has not been seen in office yet that there are no notes or orders concerning a sleep study in his chart (we only have this phone note).   I explained to him that I plan to s/w Dr Floretta when he is back in the office on Monday 10/21/2024 about the in lab sleep study as I need the pts most recent vital signs, his s/s, his diagnosis/need for the study, an order and a stop bang.  The pt is aware and he states that he understands that he may need to wait until his first office visit with us  on 11/07/24 for this test to be ordered depending on Dr Ruthanne recommendation.  The pt verbalized understanding and thanked me for my call.

## 2024-10-15 NOTE — Telephone Encounter (Signed)
**Note De-Identified Mcgregor Tinnon Obfuscation** James Mallard, MD to [name]     10/15/24 10:32 AM Hi!   I am fine with us  ordering the CPAP. He will likely need to do the sleep study where he has to go and be monitored over night. He can get a STOPBANG score at his follow up visit.   Thanks,   [employer]

## 2024-10-21 NOTE — Telephone Encounter (Signed)
**Note De-Identified Lumi Winslett Obfuscation** Per Dr Ruthanne nurse, Lyle a Home Sleep Test will be addressed and ordered at the pts hospital f/u appointment on 11/07/2024.
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
## 2024-11-01 ENCOUNTER — Telehealth (HOSPITAL_COMMUNITY): Payer: Self-pay

## 2024-11-01 NOTE — Telephone Encounter (Signed)
 Called to confirm/remind patient of their appointment at the [facility] on 11/04/24 10:30.   Appointment:   [] Confirmed  [x] Left mess   [] No answer/No voice mail  [] VM Full/unable to leave message  [] Phone not in service  Patient reminded to bring all medications and/or complete list.  Confirmed patient has transportation. Gave directions, instructed to utilize valet parking.

## 2024-11-04 ENCOUNTER — Encounter (HOSPITAL_COMMUNITY): Payer: Self-pay

## 2024-11-04 ENCOUNTER — Ambulatory Visit (HOSPITAL_COMMUNITY): Admit: 2024-11-04 | Discharge: 2024-11-04 | Disposition: A | Attending: Physician Assistant

## 2024-11-04 ENCOUNTER — Ambulatory Visit (HOSPITAL_COMMUNITY): Payer: Self-pay | Admitting: Physician Assistant

## 2024-11-04 VITALS — BP 150/86 | HR 97 | Wt 295.2 lb

## 2024-11-04 DIAGNOSIS — R7989 Other specified abnormal findings of blood chemistry: Secondary | ICD-10-CM | POA: Diagnosis not present

## 2024-11-04 DIAGNOSIS — Z560 Unemployment, unspecified: Secondary | ICD-10-CM | POA: Insufficient documentation

## 2024-11-04 DIAGNOSIS — G4733 Obstructive sleep apnea (adult) (pediatric): Secondary | ICD-10-CM | POA: Insufficient documentation

## 2024-11-04 DIAGNOSIS — I428 Other cardiomyopathies: Secondary | ICD-10-CM | POA: Diagnosis not present

## 2024-11-04 DIAGNOSIS — I480 Paroxysmal atrial fibrillation: Secondary | ICD-10-CM | POA: Diagnosis not present

## 2024-11-04 DIAGNOSIS — Z794 Long term (current) use of insulin: Secondary | ICD-10-CM | POA: Insufficient documentation

## 2024-11-04 DIAGNOSIS — Z7901 Long term (current) use of anticoagulants: Secondary | ICD-10-CM | POA: Insufficient documentation

## 2024-11-04 DIAGNOSIS — I110 Hypertensive heart disease with heart failure: Secondary | ICD-10-CM | POA: Insufficient documentation

## 2024-11-04 DIAGNOSIS — F1010 Alcohol abuse, uncomplicated: Secondary | ICD-10-CM | POA: Insufficient documentation

## 2024-11-04 DIAGNOSIS — E1165 Type 2 diabetes mellitus with hyperglycemia: Secondary | ICD-10-CM | POA: Insufficient documentation

## 2024-11-04 DIAGNOSIS — Z87891 Personal history of nicotine dependence: Secondary | ICD-10-CM | POA: Insufficient documentation

## 2024-11-04 DIAGNOSIS — F1910 Other psychoactive substance abuse, uncomplicated: Secondary | ICD-10-CM | POA: Diagnosis not present

## 2024-11-04 DIAGNOSIS — I10 Essential (primary) hypertension: Secondary | ICD-10-CM

## 2024-11-04 DIAGNOSIS — Z59868 Other specified financial insecurity: Secondary | ICD-10-CM | POA: Insufficient documentation

## 2024-11-04 DIAGNOSIS — I5020 Unspecified systolic (congestive) heart failure: Secondary | ICD-10-CM | POA: Diagnosis not present

## 2024-11-04 DIAGNOSIS — Z7985 Long-term (current) use of injectable non-insulin antidiabetic drugs: Secondary | ICD-10-CM | POA: Insufficient documentation

## 2024-11-04 DIAGNOSIS — Z79891 Long term (current) use of opiate analgesic: Secondary | ICD-10-CM | POA: Insufficient documentation

## 2024-11-04 DIAGNOSIS — Z7984 Long term (current) use of oral hypoglycemic drugs: Secondary | ICD-10-CM | POA: Diagnosis not present

## 2024-11-04 DIAGNOSIS — Z79899 Other long term (current) drug therapy: Secondary | ICD-10-CM | POA: Insufficient documentation

## 2024-11-04 DIAGNOSIS — I509 Heart failure, unspecified: Secondary | ICD-10-CM | POA: Insufficient documentation

## 2024-11-04 DIAGNOSIS — Z86718 Personal history of other venous thrombosis and embolism: Secondary | ICD-10-CM | POA: Diagnosis not present

## 2024-11-04 LAB — BASIC METABOLIC PANEL WITH GFR
Anion gap: 10 (ref 5–15)
BUN: 20 mg/dL (ref 6–20)
CO2: 26 mmol/L (ref 22–32)
Calcium: 9.5 mg/dL (ref 8.9–10.3)
Chloride: 100 mmol/L (ref 98–111)
Creatinine, Ser: 1.15 mg/dL (ref 0.61–1.24)
GFR, Estimated: >60 mL/min
Glucose, Bld: 256 mg/dL — ABNORMAL HIGH (ref 70–99)
Potassium: 5.2 mmol/L — ABNORMAL HIGH (ref 3.5–5.1)
Sodium: 136 mmol/L (ref 135–145)

## 2024-11-04 LAB — PRO BRAIN NATRIURETIC PEPTIDE: Pro Brain Natriuretic Peptide: 119 pg/mL

## 2024-11-04 MED ORDER — ROSUVASTATIN CALCIUM 20 MG PO TABS: 20.0000 mg | ORAL_TABLET | Freq: Every day | ORAL | 5 refills | Status: AC

## 2024-11-04 MED ORDER — APIXABAN 5 MG PO TABS: 5.0000 mg | ORAL_TABLET | Freq: Two times a day (BID) | ORAL | 3 refills | Status: AC

## 2024-11-04 MED ORDER — SPIRONOLACTONE 25 MG PO TABS: 12.5000 mg | ORAL_TABLET | Freq: Every day | ORAL | Status: AC

## 2024-11-04 MED ORDER — FUROSEMIDE 40 MG PO TABS: 40.0000 mg | ORAL_TABLET | ORAL | 3 refills | Status: AC

## 2024-11-04 MED ORDER — SACUBITRIL-VALSARTAN 24-26 MG PO TABS: 1.0000 | ORAL_TABLET | Freq: Two times a day (BID) | ORAL | 5 refills | Status: DC

## 2024-11-04 MED ORDER — CARVEDILOL 12.5 MG PO TABS: 12.5000 mg | ORAL_TABLET | Freq: Two times a day (BID) | ORAL | 5 refills | Status: DC

## 2024-11-04 MED ORDER — SPIRONOLACTONE 25 MG PO TABS: 25.0000 mg | ORAL_TABLET | Freq: Every day | ORAL | 5 refills | Status: DC

## 2024-11-04 MED ORDER — EMPAGLIFLOZIN 10 MG PO TABS: 10.0000 mg | ORAL_TABLET | Freq: Every day | ORAL | 5 refills | Status: AC

## 2024-11-04 NOTE — Patient Instructions (Signed)
 Medication Changes:  START SPIRONOLACTONE  25MG  ONCE DAILY   TAKE LASIX  (FUROSEMIDE ) 40MG  DAILY FOR 4 DAYS, THEN DECREASE TO 40MG  THREE TIMES WEEKLY ON MONDAY, WEDNESDAY, FRIDAY   Lab Work:  Labs done today, your results will be available in MyChart, we will contact you for abnormal readings.  Referrals:  YOU HAVE BEEN REFERRED TO CARDIAC REHAB  THEY WILL REACH OUT TO YOU OR CALL TO ARRANGE THIS. PLEASE CALL US  WITH ANY CONCERNS   YOU HAVE BEEN REFERRED TO PULMONOLOGY THEY WILL REACH OUT TO YOU OR CALL TO ARRANGE THIS. PLEASE CALL US  WITH ANY CONCERNS   Follow-Up in: AS SCHEDULED WITH DR. ZENAIDA ON 1/27  At the [facility], you and your health needs are our priority. We have a designated team specialized in the treatment of Heart Failure. This Care Team includes your primary Heart Failure Specialized Cardiologist (physician), Advanced Practice Providers (APPs- Physician Assistants and Nurse Practitioners), and Pharmacist who all work together to provide you with the care you need, when you need it.   You may see any of the following providers on your designated Care Team at your next follow up:  Dr. Toribio Fuel Dr. Ezra Shuck Dr. Odis Zenaida Greig Mosses, NP Caffie Shed, [place] [facility] [place], [place] Beckey Coe, NP Jordan Lee, NP Tinnie Redman, PharmD   Please be sure to bring in all your medications bottles to every appointment.   Need to Contact Us :  If you have any questions or concerns before your next appointment please send us  a message through [facility] or call our office at [phone].    TO LEAVE A MESSAGE FOR THE NURSE SELECT OPTION 2, PLEASE LEAVE A MESSAGE INCLUDING: YOUR NAME DATE OF BIRTH CALL BACK NUMBER REASON FOR CALL**this is important as we prioritize the call backs  YOU WILL RECEIVE A CALL BACK THE SAME DAY AS LONG AS YOU CALL BEFORE 4:00 PM

## 2024-11-06 ENCOUNTER — Telehealth (HOSPITAL_COMMUNITY): Payer: Self-pay

## 2024-11-06 ENCOUNTER — Encounter (HOSPITAL_COMMUNITY): Payer: Self-pay

## 2024-11-06 NOTE — Telephone Encounter (Signed)
Attempted to call patient in regards to Cardiac Rehab - LM on VM   Sent letter 
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
## 2024-11-07 ENCOUNTER — Ambulatory Visit: Admitting: Student in an Organized Health Care Education/Training Program

## 2024-11-07 ENCOUNTER — Telehealth: Payer: Self-pay | Admitting: Student in an Organized Health Care Education/Training Program

## 2024-11-07 NOTE — Telephone Encounter (Signed)
 Pt was in hospital and needs a CPAP machine. Looking for sooner appt than 1/28.

## 2024-11-12 ENCOUNTER — Emergency Department (HOSPITAL_COMMUNITY)

## 2024-11-12 ENCOUNTER — Emergency Department (HOSPITAL_COMMUNITY): Admission: EM | Admit: 2024-11-12 | Discharge: 2024-11-12 | Disposition: A | Discharge status: Home / Self Care

## 2024-11-12 ENCOUNTER — Other Ambulatory Visit: Payer: Self-pay

## 2024-11-12 ENCOUNTER — Encounter (HOSPITAL_COMMUNITY): Payer: Self-pay

## 2024-11-12 DIAGNOSIS — R0789 Other chest pain: Secondary | ICD-10-CM | POA: Diagnosis present

## 2024-11-12 DIAGNOSIS — Z7901 Long term (current) use of anticoagulants: Secondary | ICD-10-CM | POA: Insufficient documentation

## 2024-11-12 DIAGNOSIS — Z794 Long term (current) use of insulin: Secondary | ICD-10-CM | POA: Diagnosis not present

## 2024-11-12 DIAGNOSIS — M62838 Other muscle spasm: Secondary | ICD-10-CM | POA: Insufficient documentation

## 2024-11-12 LAB — CBC WITH DIFFERENTIAL/PLATELET
Abs Immature Granulocytes: 0.01 K/uL (ref 0.00–0.07)
Basophils Absolute: 0 K/uL (ref 0.0–0.1)
Basophils Relative: 1 %
Eosinophils Absolute: 0.1 K/uL (ref 0.0–0.5)
Eosinophils Relative: 2 %
HCT: 49.5 % (ref 39.0–52.0)
Hemoglobin: 15.9 g/dL (ref 13.0–17.0)
Immature Granulocytes: 0 %
Lymphocytes Relative: 37 %
Lymphs Abs: 2.2 K/uL (ref 0.7–4.0)
MCH: 25.1 pg — ABNORMAL LOW (ref 26.0–34.0)
MCHC: 32.1 g/dL (ref 30.0–36.0)
MCV: 78.1 fL — ABNORMAL LOW (ref 80.0–100.0)
Monocytes Absolute: 0.7 K/uL (ref 0.1–1.0)
Monocytes Relative: 13 %
Neutro Abs: 2.7 K/uL (ref 1.7–7.7)
Neutrophils Relative %: 47 %
Platelets: 235 K/uL (ref 150–400)
RBC: 6.34 MIL/uL — ABNORMAL HIGH (ref 4.22–5.81)
RDW: 16.3 % — ABNORMAL HIGH (ref 11.5–15.5)
WBC: 5.8 K/uL (ref 4.0–10.5)
nRBC: 0 % (ref 0.0–0.2)

## 2024-11-12 LAB — COMPREHENSIVE METABOLIC PANEL WITH GFR
ALT: 28 U/L (ref 0–44)
AST: 35 U/L (ref 15–41)
Albumin: 4.1 g/dL (ref 3.5–5.0)
Alkaline Phosphatase: 59 U/L (ref 38–126)
Anion gap: 12 (ref 5–15)
BUN: 20 mg/dL (ref 6–20)
CO2: 22 mmol/L (ref 22–32)
Calcium: 9.1 mg/dL (ref 8.9–10.3)
Chloride: 99 mmol/L (ref 98–111)
Creatinine, Ser: 1.34 mg/dL — ABNORMAL HIGH (ref 0.61–1.24)
GFR, Estimated: >60 mL/min
Glucose, Bld: 185 mg/dL — ABNORMAL HIGH (ref 70–99)
Potassium: 5.3 mmol/L — ABNORMAL HIGH (ref 3.5–5.1)
Sodium: 133 mmol/L — ABNORMAL LOW (ref 135–145)
Total Bilirubin: 0.5 mg/dL (ref 0.0–1.2)
Total Protein: 7.8 g/dL (ref 6.5–8.1)

## 2024-11-12 LAB — I-STAT CHEM 8, ED
BUN: 26 mg/dL — ABNORMAL HIGH (ref 6–20)
Calcium, Ion: 1.05 mmol/L — ABNORMAL LOW (ref 1.15–1.40)
Chloride: 102 mmol/L (ref 98–111)
Creatinine, Ser: 1.4 mg/dL — ABNORMAL HIGH (ref 0.61–1.24)
Glucose, Bld: 177 mg/dL — ABNORMAL HIGH (ref 70–99)
HCT: 51 % (ref 39.0–52.0)
Hemoglobin: 17.3 g/dL — ABNORMAL HIGH (ref 13.0–17.0)
Potassium: 5.1 mmol/L (ref 3.5–5.1)
Sodium: 136 mmol/L (ref 135–145)
TCO2: 26 mmol/L (ref 22–32)

## 2024-11-12 LAB — TROPONIN T, HIGH SENSITIVITY: Troponin T High Sensitivity: 27 ng/L — ABNORMAL HIGH (ref 0–19)

## 2024-11-12 MED ORDER — LIDOCAINE 5 % EX PTCH: 1.0000 | MEDICATED_PATCH | CUTANEOUS | 0 refills | Status: AC

## 2024-11-12 MED ORDER — HYDROMORPHONE HCL 1 MG/ML IJ SOLN
1.0000 mg | Freq: Once | INTRAMUSCULAR | Status: AC
  Administered 2024-11-12: 1 mg via INTRAVENOUS
  Filled 2024-11-12: qty 1

## 2024-11-12 MED ORDER — METHOCARBAMOL 1000 MG/10ML IJ SOLN
1000.0000 mg | Freq: Once | INTRAMUSCULAR | Status: AC
  Administered 2024-11-12: 1000 mg via INTRAVENOUS
  Filled 2024-11-12: qty 10

## 2024-11-12 MED ORDER — DIAZEPAM 5 MG/ML IJ SOLN
3.0000 mg | Freq: Once | INTRAMUSCULAR | Status: AC
  Administered 2024-11-12: 3 mg via INTRAVENOUS
  Filled 2024-11-12: qty 2

## 2024-11-12 MED ORDER — DIAZEPAM 5 MG PO TABS: 5.0000 mg | ORAL_TABLET | Freq: Three times a day (TID) | ORAL | 0 refills | Status: AC | PRN

## 2024-11-12 MED ORDER — METHOCARBAMOL 500 MG PO TABS: 1000.0000 mg | ORAL_TABLET | Freq: Four times a day (QID) | ORAL | 0 refills | Status: AC | PRN

## 2024-11-12 MED ORDER — LIDOCAINE 5 % EX PTCH
1.0000 | MEDICATED_PATCH | CUTANEOUS | Status: DC
  Administered 2024-11-12: 1 via TRANSDERMAL
  Filled 2024-11-12: qty 1

## 2024-11-12 MED ORDER — IOHEXOL 350 MG/ML SOLN
75.0000 mL | Freq: Once | INTRAVENOUS | Status: AC | PRN
  Administered 2024-11-12: 75 mL via INTRAVENOUS

## 2024-11-12 NOTE — ED Provider Notes (Signed)
 " [facility] Provider Note   CSN: [id number] Arrival date & time: 11/12/24  1242     Patient presents with: Chest Pain and Shortness of Breath   Colon Curran is a 54 y.o. male.   54 year old male presents for evaluation of chest pain and neck pain.  States he recently had a heart cath in which they went on on the right side of his neck 2 weeks ago.  States that for the last couple days he has had worsening neck pain on the right side.  States he feels like he is unable to move his neck, and cannot lift his head up because it feels heavy and very sore.  States the pain radiates out to his right shoulder and down to the middle of his back.  He states he feels numb in his right neck and is having difficulty with moving his fingers and some numbness in his hands as well.  Denies any other symptoms or concerns.   Chest Pain Associated symptoms: shortness of breath   Associated symptoms: no abdominal pain, no back pain, no cough, no fever, no palpitations and no vomiting   Shortness of Breath Associated symptoms: chest pain and neck pain   Associated symptoms: no abdominal pain, no cough, no ear pain, no fever, no rash, no sore throat and no vomiting        Prior to Admission medications  Medication Sig Start Date End Date Taking? Authorizing Provider  diazepam  (VALIUM ) 5 MG tablet Take 1 tablet (5 mg total) by mouth every 8 (eight) hours as needed for up to 4 days for muscle spasms. 11/12/24 11/16/24 Yes Aariv Medlock L, DO  lidocaine  (LIDODERM ) 5 % Place 1 patch onto the skin daily. Remove & Discard patch within 12 hours or as directed by MD 11/12/24  Yes Zorana Brockwell L, DO  methocarbamol  (ROBAXIN ) 500 MG tablet Take 2 tablets (1,000 mg total) by mouth every 6 (six) hours as needed for up to 7 days for muscle spasms. 11/12/24 11/19/24 Yes Rayshawn Visconti L, DO  albuterol  (VENTOLIN  HFA) 108 (90 Base) MCG/ACT inhaler Inhale 2 puffs into the  lungs every 4 (four) hours as needed for wheezing or shortness of breath. 08/21/24   Fausto Burnard LABOR, DO  apixaban  (ELIQUIS ) 5 MG TABS tablet Take 1 tablet (5 mg total) by mouth 2 (two) times daily. 11/04/24   Colletta Manuelita Garre, PA-C  ascorbic acid (VITAMIN C) 500 MG tablet Take 500 mg by mouth daily.    [provider]  carvedilol  (COREG ) 12.5 MG tablet Take 1 tablet (12.5 mg total) by mouth 2 (two) times daily with a meal. 11/04/24   Colletta Manuelita Garre, PA-C  empagliflozin  (JARDIANCE ) 10 MG TABS tablet Take 1 tablet (10 mg total) by mouth daily. 11/04/24   Colletta Manuelita Garre, PA-C  furosemide  (LASIX ) 40 MG tablet Take 1 tablet (40 mg total) by mouth 3 (three) times a week. ON TAMELA MOELLERS, AND FRIDAY'S 11/04/24   Colletta Manuelita Garre, PA-C  insulin  lispro (HUMALOG ) 100 UNIT/ML injection 200 Units. Every third day    [provider]  Insulin  Syringe-Needle U-100 (INSULIN  SYRINGE 1CC/31GX5/16) 31G X 5/16 1 ML MISC USE FIVE TIMES DAILY 05/08/20   Job Lukes, PA  liraglutide  (VICTOZA ) 18 MG/3ML SOPN Inject 1.8 mg into the skin daily.    [provider]  metFORMIN  (GLUCOPHAGE ) 500 MG tablet TAKE 4 TABLETS BY MOUTH EVERY DAY IN THE MORNING Patient not taking:  Reported on 11/04/2024 06/04/21   Job Lukes, PA  methadone  (DOLOPHINE ) 10 MG tablet Take 10 mg by mouth in the morning, at noon, and at bedtime. 07/22/20   [provider]  nicotine  (NICODERM CQ  - DOSED IN MG/24 HOURS) 14 mg/24hr patch Place 1 patch (14 mg total) onto the skin daily. 08/22/24   Fausto Burnard LABOR, DO  omeprazole  (PRILOSEC) 40 MG capsule Take 40 mg by mouth daily.    [provider]  [facility] VERIO test strip USE TO TEST BLOOD SUGAR THREE TIMES DAILY 11/22/17   Von [place], MD  polyethylene glycol powder (GLYCOLAX /MIRALAX ) 17 GM/SCOOP powder Dissolve 2 capfuls (34g) in 4-8 ounces of liquid and take by mouth daily. 10/08/24   D'Mello, Rosalyn, DO  rosuvastatin   (CRESTOR ) 20 MG tablet Take 1 tablet (20 mg total) by mouth daily. 11/04/24   Colletta Manuelita Garre, PA-C  sacubitril -valsartan  (ENTRESTO ) 24-26 MG Take 1 tablet by mouth 2 (two) times daily. 11/04/24   Colletta Manuelita Garre, PA-C  senna-docusate (SENOKOT-S) 8.6-50 MG tablet Take 1 tablet by mouth at bedtime. 10/08/24   D'Mello, Rosalyn, DO  sildenafil  (VIAGRA ) 100 MG tablet Take 100 mg by mouth as needed for erectile dysfunction. 05/30/24   [provider]  spironolactone  (ALDACTONE ) 25 MG tablet Take 0.5 tablets (12.5 mg total) by mouth daily. 11/04/24   Colletta Manuelita Garre, PA-C  testosterone  cypionate (DEPOTESTOSTERONE CYPIONATE) 200 MG/ML injection INJECT 1 ML INTO THE MUSCLE EVERY 14 DAYS 12/03/19   Von [place], MD  Vitamin A 3 MG (10000 UT) TABS Take 1 tablet by mouth daily.    [provider]  vitamin B-12 (CYANOCOBALAMIN) 100 MCG tablet Take 100 mcg by mouth daily.    [provider]  zolpidem  (AMBIEN ) 10 MG tablet Take 10-20 mg by mouth at bedtime as needed for sleep. 08/16/24   [provider]    Allergies: Patient has no known allergies.    Review of Systems  Constitutional:  Negative for chills and fever.  HENT:  Negative for ear pain and sore throat.   Eyes:  Negative for pain and visual disturbance.  Respiratory:  Positive for shortness of breath. Negative for cough.   Cardiovascular:  Positive for chest pain. Negative for palpitations.  Gastrointestinal:  Negative for abdominal pain and vomiting.  Genitourinary:  Negative for dysuria and hematuria.  Musculoskeletal:  Positive for neck pain. Negative for arthralgias and back pain.  Skin:  Negative for color change and rash.  Neurological:  Negative for seizures and syncope.  All other systems reviewed and are negative.   Updated Vital Signs BP 123/82 (BP Location: Right Arm)   Pulse 97   Temp 97.8 F (36.6 C) (Oral)   Resp 18   Ht 5' 11 (1.803 m)   Wt 127 kg   SpO2 99%   BMI 39.05  kg/m   Physical Exam Vitals and nursing note reviewed.  Constitutional:      General: He is not in acute distress.    Appearance: He is well-developed. He is not ill-appearing.  HENT:     Head: Normocephalic and atraumatic.  Eyes:     Conjunctiva/sclera: Conjunctivae normal.  Neck:     Comments: Right sided cervical musculature and outer shoulder tenderness to palpation, hypertonicity  Cardiovascular:     Rate and Rhythm: Normal rate and regular rhythm.     Heart sounds: No murmur heard. Pulmonary:     Effort: Pulmonary effort is normal. No respiratory distress.  Breath sounds: Normal breath sounds.  Abdominal:     Palpations: Abdomen is soft.     Tenderness: There is no abdominal tenderness.  Musculoskeletal:        General: No swelling.     Cervical back: Neck supple.  Skin:    General: Skin is warm and dry.     Capillary Refill: Capillary refill takes less than 2 seconds.  Neurological:     Mental Status: He is alert.     Comments: Right arm grip strength decreased, left arm flaccid at baseline   Psychiatric:        Mood and Affect: Mood normal.     (all labs ordered are listed, but only abnormal results are displayed) Labs Reviewed  CBC WITH DIFFERENTIAL/PLATELET - Abnormal; Notable for the following components:      Result Value   RBC 6.34 (*)    MCV 78.1 (*)    MCH 25.1 (*)    RDW 16.3 (*)    All other components within normal limits  COMPREHENSIVE METABOLIC PANEL WITH GFR - Abnormal; Notable for the following components:   Sodium 133 (*)    Potassium 5.3 (*)    Glucose, Bld 185 (*)    Creatinine, Ser 1.34 (*)    All other components within normal limits  I-STAT CHEM 8, ED - Abnormal; Notable for the following components:   BUN 26 (*)    Creatinine, Ser 1.40 (*)    Glucose, Bld 177 (*)    Calcium , Ion 1.05 (*)    Hemoglobin 17.3 (*)    All other components within normal limits  TROPONIN T, HIGH SENSITIVITY - Abnormal; Notable for the following  components:   Troponin T High Sensitivity 27 (*)    All other components within normal limits  TROPONIN T, HIGH SENSITIVITY    EKG: EKG Interpretation Date/Time:  Tuesday November 12 2024 13:41:32 EST Ventricular Rate:  87 PR Interval:  184 QRS Duration:  100 QT Interval:  366 QTC Calculation: 440 R Axis:   40  Text Interpretation: Normal sinus rhythm Minimal voltage criteria for LVH, may be normal variant ( Sokolow-Lyon )  Compared with prior EKG from 10/06/2024 Confirmed by Gennaro Bouchard ([id number]) on 11/12/2024 3:07:00 PM  Radiology: CT Angio Neck W and/or Wo Contrast Result Date: 11/12/2024 EXAM: CTA Neck 11/12/2024 05:09:00 PM TECHNIQUE: CT of the neck was performed with the administration of intravenous contrast. Multiplanar 2D and/or 3D reformatted images are provided for review. Automated exposure control, iterative reconstruction, and/or weight based adjustment of the mA/kV was utilized to reduce the radiation dose to as low as reasonably achievable. Stenosis of the internal carotid arteries measured using NASCET criteria. COMPARISON: None available CLINICAL HISTORY: FINDINGS: AORTIC ARCH AND ARCH VESSELS: Assessment of the arch vessels is limited by streak artifact from dense venous contrast. No gross high grade stenosis of the brachiocephalic or proximal subclavian arteries is evident. CERVICAL CAROTID ARTERIES: Streak artifact obscures a portion of the proximal right common carotid artery. No dissection, arterial injury, or hemodynamically significant stenosis by NASCET criteria elsewhere. CERVICAL VERTEBRAL ARTERIES: The vertebral arteries are patent with the left being mildly dominant. Streak artifact from venous contrast and a left clavicle fixation plate and photon starvation related to imaging through the patient's shoulders limit detailed assessment of the V1 and proximal V2 segments. There is no evidence of a dissection or flow limiting stenosis of the more distal vertebral  arteries. LUNGS AND MEDIASTINUM: Unremarkable. SOFT TISSUES: No acute abnormality. BONES: Previous  ORIF of the left clavicle and posterior decompression in the mid and lower cervical spine. IMPRESSION: 1. Limited assessment of the arteries in the upper chest and lower neck due to streak artifact. No evidence of a significant stenosis or dissection more distally in the neck. Electronically signed by: Dasie Hamburg MD 11/12/2024 05:42 PM EST RP Workstation: [id number]   DG Chest 2 View Result Date: 11/12/2024 EXAM: 2 VIEW(S) XRAY OF THE CHEST 11/12/2024 01:54:00 PM COMPARISON: 10/05/2024 CLINICAL HISTORY: chest pain chest pain chest pain chest pain chest pain FINDINGS: LUNGS AND PLEURA: No focal pulmonary opacity. No pleural effusion. No pneumothorax. HEART AND MEDIASTINUM: No acute abnormality of the cardiac and mediastinal silhouettes. BONES AND SOFT TISSUES: Left clavicle fixation hardware. IMPRESSION: 1. No acute findings. Electronically signed by: Norman Gatlin MD 11/12/2024 02:01 PM EST RP Workstation: [id number]     Procedures   Medications Ordered in the ED  lidocaine  (LIDODERM ) 5 % 1 patch (1 patch Transdermal Patch Applied 11/12/24 1839)  HYDROmorphone  (DILAUDID ) injection 1 mg (1 mg Intravenous Given 11/12/24 1408)  HYDROmorphone  (DILAUDID ) injection 1 mg (1 mg Intravenous Given 11/12/24 1553)  methocarbamol  (ROBAXIN ) injection 1,000 mg (1,000 mg Intravenous Given 11/12/24 1554)  diazepam  (VALIUM ) injection 3 mg (3 mg Intravenous Given 11/12/24 1632)  iohexol  (OMNIPAQUE ) 350 MG/ML injection 75 mL (75 mLs Intravenous Contrast Given 11/12/24 1709)  diazepam  (VALIUM ) injection 3 mg (3 mg Intravenous Given 11/12/24 1836)                                    Medical Decision Making Cardiac monitor interpretation: Sinus rhythm, no ectopy  Patient here for neck pain and atypical chest pain.  CTA of the neck is negative, and workup is unremarkable.  Troponin slightly above normal but he has a  troponin leak at baseline and a history of CHF.  Required multiple doses of pain meds as well as Valium  and Robaxin  to help get him able to lay flat for CT scan.  Although now he does finally have relief of his pain.  I think he just has a very severe muscle spasm/pinched nerve.  Will give prescription for Valium  and Robaxin .  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  Advised to stay on his medications as prescribed and follow-up with primary care.  Advised to return to the ER for any new or worsening symptoms.  Patient feels comfortable with the plan to be discharged home.  All results and plan were discussed with him and family at bedside.  Problems Addressed: Atypical chest pain: acute illness or injury Muscle spasms of neck: acute illness or injury  Amount and/or Complexity of Data Reviewed External Data Reviewed: notes.    Details: Prior hospital records reviewed and patient was admitted 10-05-2024 for acute respiratory failure Labs: ordered. Decision-making details documented in ED Course.    Details: Ordered and reviewed by me and fairly unremarkable, troponin leak at baseline Radiology: ordered and independent interpretation performed. Decision-making details documented in ED Course.    Details: Ordered and interpreted by me independently of radiology Chest x-ray: Shows no acute abnormality CT angiogram of the chest: Shows no acute abnormality ECG/medicine tests: ordered and independent interpretation performed. Decision-making details documented in ED Course.    Details: Ordered and interpreted by me in the absence of cardiology and shows sinus rhythm, no STEMI, or significant change when compared to prior EKG  Risk OTC drugs. Prescription drug management. Parenteral  controlled substances. Drug therapy requiring intensive monitoring for toxicity.     Final diagnoses:  Muscle spasms of neck  Atypical chest pain    ED Discharge Orders          Ordered    methocarbamol  (ROBAXIN ) 500 MG  tablet  Every 6 hours PRN        11/12/24 1815    diazepam  (VALIUM ) 5 MG tablet  Every 8 hours PRN        11/12/24 1815    lidocaine  (LIDODERM ) 5 %  Every 24 hours        11/12/24 1816               Gennaro Duwaine CROME, DO 11/12/24 2117  "

## 2024-11-12 NOTE — ED Triage Notes (Signed)
 Pt c/o left sided chest pain and pain in right neck, [id number].

## 2024-11-12 NOTE — ED Notes (Signed)
Pt was given discharge instructions and verbalized understanding.

## 2024-11-12 NOTE — ED Triage Notes (Signed)
 Patient is complaining of chest pain on the left, he is also having right sided neck pain where he had surgery 2 weeks ago. Pain is going from the right side of his neck down the right arm.  He is also complaining of shob for the past 3 shob.   PMH: CHF

## 2024-11-12 NOTE — Discharge Instructions (Addendum)
 Use heating pack and massage on your neck and shoulder.  Take Robaxin  4 times a day, you can use Tylenol  as needed you can use your Valium  up to 3 times a day as needed.  Use your lidocaine  patches as needed.

## 2024-11-12 NOTE — ED Provider Triage Note (Signed)
 Emergency Medicine Provider Triage Evaluation Note  Rebel Fitterer , a 54 y.o. male  was evaluated in triage.  Pt complains of neck pain.  Pt had a cardiac cath  dec 15, neck pain since.  Pt had cath through right jugular. Pt has a greenfield catheter   Review of Systems  Positive: Right arm and right neck pain.   Negative: fever  Physical Exam  BP (!) 168/111 (BP Location: Right Arm)   Pulse 97   Temp 98.2 F (36.8 C) (Oral)   Resp 18   Ht 5' 11 (1.803 m)   Wt 127 kg   SpO2 96%   BMI 39.05 kg/m  Gen:   Awake, no distress   Resp:  Normal effort  MSK:   Moves extremities without difficulty  Other:    Medical Decision Making  Medically screening exam initiated at 1:32 PM.  Appropriate orders placed.  Scorpio Guess was informed that the remainder of the evaluation will be completed by another provider, this initial triage assessment does not replace that evaluation, and the importance of remaining in the ED until their evaluation is complete.     Flint Sonny POUR, [place] 11/12/24 1334

## 2024-11-12 NOTE — ED Notes (Signed)
 Pt reports pain medicine making pain worse

## 2024-11-19 ENCOUNTER — Ambulatory Visit (HOSPITAL_COMMUNITY): Admitting: Cardiology

## 2024-11-19 ENCOUNTER — Telehealth (HOSPITAL_COMMUNITY): Payer: Self-pay | Admitting: *Deleted

## 2024-11-19 NOTE — Telephone Encounter (Signed)
 Pt  left message on Cardiac Rehab departmental voicemail regarding returning a call regarding his CPAP machine.  Attempted to return call on number provided  to advise he had called the wrong number for sleep study/CPAP.  Unable to leave message received message unable to complete this call at this time. Saturnino Bernett PEAK, BSN Cardiac and Emergency Planning/management Officer

## 2024-11-20 ENCOUNTER — Telehealth (HOSPITAL_COMMUNITY): Payer: Self-pay

## 2024-11-20 ENCOUNTER — Ambulatory Visit: Attending: Cardiovascular Disease | Admitting: Student in an Organized Health Care Education/Training Program

## 2024-11-20 VITALS — BP 120/60 | HR 109 | Ht 71.0 in | Wt 303.8 lb

## 2024-11-20 DIAGNOSIS — G4733 Obstructive sleep apnea (adult) (pediatric): Secondary | ICD-10-CM

## 2024-11-20 DIAGNOSIS — I5021 Acute systolic (congestive) heart failure: Secondary | ICD-10-CM

## 2024-11-20 DIAGNOSIS — Z79899 Other long term (current) drug therapy: Secondary | ICD-10-CM | POA: Diagnosis not present

## 2024-11-20 DIAGNOSIS — I509 Heart failure, unspecified: Secondary | ICD-10-CM

## 2024-11-20 MED ORDER — CARVEDILOL 25 MG PO TABS: 25.0000 mg | ORAL_TABLET | Freq: Two times a day (BID) | ORAL | 3 refills | Status: AC

## 2024-11-20 MED ORDER — SACUBITRIL-VALSARTAN 49-51 MG PO TABS: 1.0000 | ORAL_TABLET | Freq: Two times a day (BID) | ORAL | 3 refills | Status: AC
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
## 2024-11-20 NOTE — Telephone Encounter (Signed)
 Attempted f/u call regarding cardiac rehab- no answer, left message. Closing referral.

## 2024-11-20 NOTE — Patient Instructions (Addendum)
 Medication Instructions:  Please INCREASE your carvedilol  to 25 mg twice a day.   Please INCREASE your dose of entresto  to 49-51. *If you need a refill on your cardiac medications before your next appointment, please call your pharmacy*  Lab Work: Please complete a BNP today in our first floor lab before you leave.  If you have labs (blood work) drawn today and your tests are completely normal, you will receive your results only by: MyChart Message (if you have MyChart) OR A paper copy in the mail If you have any lab test that is abnormal or we need to change your treatment, we will call you to review the results.  Testing/Procedures: Your physician has requested that you have an echocardiogram in March 2026. Echocardiography is a painless test that uses sound waves to create images of your heart. It provides your doctor with information about the size and shape of your heart and how well your hearts chambers and valves are working. This procedure takes approximately one hour. There are no restrictions for this procedure. Please do NOT wear cologne, perfume, aftershave, or lotions (deodorant is allowed). Please arrive 15 minutes prior to your appointment time.  Please note: We ask at that you not bring children with you during ultrasound (echo/ vascular) testing. Due to room size and safety concerns, children are not allowed in the ultrasound rooms during exams. Our front office staff cannot provide observation of children in our lobby area while testing is being conducted. An adult accompanying a patient to their appointment will only be allowed in the ultrasound room at the discretion of the ultrasound technician under special circumstances. We apologize for any inconvenience.  Your physician has recommended that you have an in-lab sleep study. This test records several body functions during sleep, including: brain activity, eye movement, oxygen and carbon dioxide blood levels, heart rate and  rhythm, breathing rate and rhythm, the flow of air through your mouth and nose, snoring, body muscle movements, and chest and belly movement.   Follow-Up: At [facility], you and your health needs are our priority.  As part of our continuing mission to provide you with exceptional heart care, our providers are all part of one team.  This team includes your primary Cardiologist (physician) and Advanced Practice Providers or APPs (Physician Assistants and Nurse Practitioners) who all work together to provide you with the care you need, when you need it.  Your next appointment:   2 month(s) (after you have completed your echocardiogram in March 2026)  Provider:   Georganna Archer, MD    We recommend signing up for the patient portal called MyChart.  Sign up information is provided on this After Visit Summary.  MyChart is used to connect with patients for Virtual Visits (Telemedicine).  Patients are able to view lab/test results, encounter notes, upcoming appointments, etc.  Non-urgent messages can be sent to your provider as well.   To learn more about what you can do with MyChart, go to [email or web address].

## 2024-11-21 ENCOUNTER — Ambulatory Visit: Payer: Self-pay | Admitting: Student in an Organized Health Care Education/Training Program

## 2024-11-21 ENCOUNTER — Telehealth: Payer: Self-pay | Admitting: Student in an Organized Health Care Education/Training Program

## 2024-11-21 LAB — PRO B NATRIURETIC PEPTIDE: NT-Pro BNP: 71 pg/mL (ref 0–210)

## 2024-11-21 NOTE — Telephone Encounter (Signed)
Patient called to follow-up on his lab results. 

## 2024-11-21 NOTE — Telephone Encounter (Signed)
 Responded through pt my chart message. Will close encounter. Sent to Dr Floretta

## 2024-11-21 NOTE — Telephone Encounter (Signed)
 See 11/20/24 encounter.

## 2024-11-22 NOTE — Telephone Encounter (Signed)
 Pt returned phone call, please advise.

## 2024-11-22 NOTE — Telephone Encounter (Signed)
 Left message to call back. Spoke with [facility] and labs should be finalized by tonight or tomorrow.

## 2024-11-22 NOTE — Telephone Encounter (Signed)
Pt contacted and advised.

## 2024-11-23 LAB — BASIC METABOLIC PANEL WITH GFR
BUN/Creatinine Ratio: 19 (ref 9–20)
BUN: 21 mg/dL (ref 6–24)
CO2: 21 mmol/L (ref 20–29)
Calcium: 9.2 mg/dL (ref 8.7–10.2)
Chloride: 99 mmol/L (ref 96–106)
Creatinine, Ser: 1.11 mg/dL (ref 0.76–1.27)
Glucose: 194 mg/dL — AB (ref 70–99)
Potassium: 5.1 mmol/L (ref 3.5–5.2)
Sodium: 136 mmol/L (ref 134–144)
eGFR: 78 mL/min/{1.73_m2}

## 2024-11-23 LAB — SPECIMEN STATUS REPORT

## 2024-11-28 ENCOUNTER — Telehealth (HOSPITAL_COMMUNITY): Payer: Self-pay

## 2024-11-28 NOTE — Telephone Encounter (Signed)
 Patient called back, stated he wants to meet with his cardiologist first before having us  reopen the referral. Informed patient he can call us  back whenever he is ready to go over the cardiac rehab program as he did not want to discuss it at the moment.  Closing referral.

## 2024-11-28 NOTE — Telephone Encounter (Signed)
 Message left on 2/04 after office hours stating patient wants referral reopened. Attempted to call back- no answer, left message.

## 2025-01-09 ENCOUNTER — Ambulatory Visit (HOSPITAL_BASED_OUTPATIENT_CLINIC_OR_DEPARTMENT_OTHER): Admitting: Pulmonary Disease

## 2025-01-10 ENCOUNTER — Ambulatory Visit (HOSPITAL_COMMUNITY)

## 2025-01-15 ENCOUNTER — Ambulatory Visit: Admitting: Student in an Organized Health Care Education/Training Program
